# Patient Record
Sex: Female | Born: 1990
Health system: Southern US, Community
[De-identification: ages and names within clinical notes are randomized; demographics above are authoritative.]

## PROBLEM LIST (undated history)

## (undated) DIAGNOSIS — R42 Dizziness and giddiness: Secondary | ICD-10-CM

## (undated) DIAGNOSIS — L309 Dermatitis, unspecified: Secondary | ICD-10-CM

## (undated) DIAGNOSIS — R51 Headache: Secondary | ICD-10-CM

## (undated) DIAGNOSIS — K219 Gastro-esophageal reflux disease without esophagitis: Secondary | ICD-10-CM

## (undated) HISTORY — DX: Gastro-esophageal reflux disease without esophagitis: K21.9

## (undated) HISTORY — DX: Dizziness and giddiness: R42

## (undated) HISTORY — DX: Dermatitis, unspecified: L30.9

## (undated) HISTORY — PX: LAPAROSCOPIC GASTRIC SLEEVE RESECTION: SHX5895

## (undated) HISTORY — PX: TUBAL LIGATION: SHX77

## (undated) HISTORY — PX: LEEP: SHX91

---
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
## 2010-03-21 ENCOUNTER — Inpatient Hospital Stay (HOSPITAL_COMMUNITY): Admission: AD | Admit: 2010-03-21 | Discharge: 2010-03-21 | Payer: Self-pay | Admitting: Obstetrics and Gynecology

## 2012-03-31 ENCOUNTER — Emergency Department (HOSPITAL_COMMUNITY)
Admission: EM | Admit: 2012-03-31 | Discharge: 2012-03-31 | Disposition: A | Discharge status: Home / Self Care | Payer: BC Managed Care – PPO | Attending: Emergency Medicine | Admitting: Emergency Medicine

## 2012-03-31 ENCOUNTER — Encounter (HOSPITAL_COMMUNITY): Payer: Self-pay | Admitting: Emergency Medicine

## 2012-03-31 ENCOUNTER — Emergency Department (HOSPITAL_COMMUNITY): Payer: BC Managed Care – PPO

## 2012-03-31 DIAGNOSIS — R51 Headache: Secondary | ICD-10-CM | POA: Insufficient documentation

## 2012-03-31 HISTORY — DX: Headache: R51

## 2012-03-31 MED ORDER — NAPROXEN 500 MG PO TABS: 500.0000 mg | ORAL_TABLET | Freq: Two times a day (BID) | ORAL | Status: DC

## 2012-03-31 MED ORDER — METOCLOPRAMIDE HCL 5 MG/ML IJ SOLN
10.0000 mg | Freq: Once | INTRAMUSCULAR | Status: AC
  Administered 2012-03-31: 10 mg via INTRAMUSCULAR
  Filled 2012-03-31: qty 2

## 2012-03-31 MED ORDER — HYDROCODONE-ACETAMINOPHEN 5-325 MG PO TABS: 2.0000 | ORAL_TABLET | ORAL | Status: DC | PRN

## 2012-03-31 MED ORDER — KETOROLAC TROMETHAMINE 60 MG/2ML IM SOLN
60.0000 mg | Freq: Once | INTRAMUSCULAR | Status: AC
  Administered 2012-03-31: 60 mg via INTRAMUSCULAR
  Filled 2012-03-31: qty 2

## 2012-03-31 NOTE — ED Notes (Signed)
Patient states she has a history of headaches, but this is different from normal; states worst ever.

## 2012-03-31 NOTE — ED Notes (Signed)
Discharge instructions reviewed with pt, questions answered. Pt verbalized understanding.  

## 2012-03-31 NOTE — ED Notes (Signed)
Patient transported to CT 

## 2012-03-31 NOTE — ED Notes (Signed)
Pt has had a tubal ligation Nov 2012 per Pt.

## 2012-03-31 NOTE — ED Provider Notes (Signed)
History     CSN: [id number]  Arrival date & time 03/31/12  0146   First MD Initiated Contact with Patient 03/31/12 0158      Chief Complaint  Patient presents with  . Headache  . Numbness    (Consider location/radiation/quality/duration/timing/severity/associated sxs/prior treatment) HPI Comments: A 21-year-old female with a history of frequent headaches who presents with complaint of left-sided parietal pain. This pain has been persistent for the last week, seems to be gradually getting worse, is not associated with visual changes, balance problems or weakness of the lower extremities but is associated with intermittent tingling of the right upper and right lower extremity this evening while she was working. She has been taking over-the-counter medications at home without any relief. She denies fevers chills vomiting or photophobia.  Does admit to having intermittent nausea. Regular headaches involve frontal temporal and occipital throbbing pain. This is a parietal throbbing pain which is much worse than her usual headache. She does have a grandmother with a history of cerebral aneurysms. Patient states that she was tested with an MRI 11 years ago but had no abnormal findings per her report.  Patient is a 21 y.o. female presenting with headaches. The history is provided by the patient and a friend.  Headache     Past Medical History  Diagnosis Date  . Headache     History reviewed. No pertinent past surgical history.  No family history on file.  History  Substance Use Topics  . Smoking status: Never Smoker   . Smokeless tobacco: Not on file  . Alcohol Use: No    OB History    Grav Para Term Preterm Abortions TAB SAB Ect Mult Living                  Review of Systems  Neurological: Positive for headaches.  All other systems reviewed and are negative.    Allergies  Review of patient's allergies indicates no known allergies.  Home Medications   Current Outpatient  Rx  Name Route Sig Dispense Refill  . HYDROCODONE-ACETAMINOPHEN 5-325 MG PO TABS Oral Take 2 tablets by mouth every 4 (four) hours as needed for pain. 6 tablet 0  . NAPROXEN 500 MG PO TABS Oral Take 1 tablet (500 mg total) by mouth 2 (two) times daily with a meal. 30 tablet 0    BP 107/81  Pulse 95  Temp 98.5 F (36.9 C) (Oral)  Resp 18  Ht 5' 7.5" (1.715 m)  Wt 220 lb (99.791 kg)  BMI 33.95 kg/m2  SpO2 97%  Physical Exam  Nursing note and vitals reviewed. Constitutional: She appears well-developed and well-nourished. No distress.  HENT:  Head: Normocephalic and atraumatic.  Mouth/Throat: Oropharynx is clear and moist. No oropharyngeal exudate.       Tender to palpation over the scalp of the left posterior parietal area, no masses, abrasions, lacerations, hematomas or abnormalities of the skin are present at this area  Eyes: Conjunctivae normal and EOM are normal. Pupils are equal, round, and reactive to light. Right eye exhibits no discharge. Left eye exhibits no discharge. No scleral icterus.  Neck: Normal range of motion. Neck supple. No JVD present. No thyromegaly present.  Cardiovascular: Normal rate, regular rhythm, normal heart sounds and intact distal pulses.  Exam reveals no gallop and no friction rub.   No murmur heard. Pulmonary/Chest: Effort normal and breath sounds normal. No respiratory distress. She has no wheezes. She has no rales.  Abdominal: Soft. Bowel sounds are  normal. She exhibits no distension and no mass. There is no tenderness.  Musculoskeletal: Normal range of motion. She exhibits no edema and no tenderness.  Lymphadenopathy:    She has no cervical adenopathy.  Neurological: She is alert. Coordination normal.       The patient has normal speech, normal memory, normal coordination of all 4 extremities without any limb ataxia. The patient has normal strength of all 4 extremities at the major muscle groups including shoulders, biceps, triceps, forearm, grips,  quadriceps, hamstrings, calves. Normal sensation to light touch and pinprick of all 4 extremities and the trunk. No truncal ataxia, normal gait, cranial nerves III through XII are intact.  Normal peripheral visual fields. Normal extraocular movements. Normal pupillary exam. No pronator drift, normal reflexes at the brachial radialis, biceps and patellar tendons. Normal position sense, normal temperature sensation to cold and warm  Skin: Skin is warm and dry. No rash noted. No erythema.  Psychiatric: She has a normal mood and affect. Her behavior is normal.    ED Course  Procedures (including critical care time)  Labs Reviewed - No data to display Ct Head Wo Contrast  03/31/2012  *RADIOLOGY REPORT*  Clinical Data: Left posterior headache  CT HEAD WITHOUT CONTRAST  Technique:  Contiguous axial images were obtained from the base of the skull through the vertex without contrast.  Comparison: None.  Findings: There is no evidence for acute hemorrhage, hydrocephalus, mass lesion, or abnormal extra-axial fluid collection.  No definite CT evidence for acute infarction.  The visualized paranasal sinuses and mastoid air cells are predominately clear.  IMPRESSION: No acute intracranial abnormality.   Original Report Authenticated By: Waneta Martins, M.D.      1. Headache       MDM  Well appearing, no distress, normal vital signs, check CT scan of the head for severe or new headache, there is no focal neurologic symptoms on my exam at this time, pain medications ordered parenterally    Patient reevaluated, has had improvement in her pain, CT scan results reviewed with the patient showing no signs of acute abnormalities, referred her to local neurology and family practice for further evaluation including MRI as an outpatient if seen necessary by primary doctor. I do not see an indication for an acute MRI at this time.    Discharge Prescriptions include:  Naprosyn Vicodin  Vida Roller,  MD 03/31/12 [phone]

## 2013-02-11 ENCOUNTER — Emergency Department (HOSPITAL_COMMUNITY): Payer: 59

## 2013-02-11 ENCOUNTER — Encounter (HOSPITAL_COMMUNITY): Payer: Self-pay | Admitting: *Deleted

## 2013-02-11 ENCOUNTER — Emergency Department (HOSPITAL_COMMUNITY)
Admission: EM | Admit: 2013-02-11 | Discharge: 2013-02-11 | Disposition: A | Discharge status: Home / Self Care | Payer: 59 | Attending: Emergency Medicine | Admitting: Emergency Medicine

## 2013-02-11 DIAGNOSIS — R011 Cardiac murmur, unspecified: Secondary | ICD-10-CM | POA: Insufficient documentation

## 2013-02-11 DIAGNOSIS — Z9851 Tubal ligation status: Secondary | ICD-10-CM | POA: Insufficient documentation

## 2013-02-11 DIAGNOSIS — R109 Unspecified abdominal pain: Secondary | ICD-10-CM

## 2013-02-11 DIAGNOSIS — R1032 Left lower quadrant pain: Secondary | ICD-10-CM | POA: Insufficient documentation

## 2013-02-11 DIAGNOSIS — Z3202 Encounter for pregnancy test, result negative: Secondary | ICD-10-CM | POA: Insufficient documentation

## 2013-02-11 DIAGNOSIS — Z791 Long term (current) use of non-steroidal anti-inflammatories (NSAID): Secondary | ICD-10-CM | POA: Insufficient documentation

## 2013-02-11 LAB — CBC WITH DIFFERENTIAL/PLATELET
Basophils Absolute: 0 10*3/uL (ref 0.0–0.1)
Eosinophils Absolute: 0 10*3/uL (ref 0.0–0.7)
Eosinophils Relative: 0 % (ref 0–5)
MCH: 28.2 pg (ref 26.0–34.0)
MCV: 85.4 fL (ref 78.0–100.0)
Neutrophils Relative %: 78 % — ABNORMAL HIGH (ref 43–77)
Platelets: 372 10*3/uL (ref 150–400)
RDW: 13.1 % (ref 11.5–15.5)
WBC: 12.2 10*3/uL — ABNORMAL HIGH (ref 4.0–10.5)

## 2013-02-11 LAB — URINALYSIS, ROUTINE W REFLEX MICROSCOPIC
Glucose, UA: NEGATIVE mg/dL
Leukocytes, UA: NEGATIVE
pH: 6 (ref 5.0–8.0)

## 2013-02-11 LAB — BASIC METABOLIC PANEL
Calcium: 9.7 mg/dL (ref 8.4–10.5)
GFR calc Af Amer: >90 mL/min (ref >90)
GFR calc non Af Amer: >90 mL/min (ref >90)
Sodium: 138 mEq/L (ref 135–145)

## 2013-02-11 LAB — PREGNANCY, URINE: Preg Test, Ur: NEGATIVE

## 2013-02-11 LAB — URINE MICROSCOPIC-ADD ON

## 2013-02-11 MED ORDER — SODIUM CHLORIDE 0.9 % IV BOLUS (SEPSIS)
1000.0000 mL | Freq: Once | INTRAVENOUS | Status: AC
  Administered 2013-02-11: 1000 mL via INTRAVENOUS

## 2013-02-11 MED ORDER — IOHEXOL 300 MG/ML  SOLN
50.0000 mL | Freq: Once | INTRAMUSCULAR | Status: AC | PRN
  Administered 2013-02-11: 50 mL via ORAL

## 2013-02-11 MED ORDER — KETOROLAC TROMETHAMINE 30 MG/ML IJ SOLN
30.0000 mg | Freq: Once | INTRAMUSCULAR | Status: AC
  Administered 2013-02-11: 30 mg via INTRAVENOUS
  Filled 2013-02-11: qty 1

## 2013-02-11 MED ORDER — NAPROXEN 500 MG PO TABS: 500.0000 mg | ORAL_TABLET | Freq: Two times a day (BID) | ORAL | Status: DC

## 2013-02-11 MED ORDER — IOHEXOL 300 MG/ML  SOLN
100.0000 mL | Freq: Once | INTRAMUSCULAR | Status: AC | PRN
  Administered 2013-02-11: 100 mL via INTRAVENOUS

## 2013-02-11 NOTE — ED Notes (Signed)
LLQ pain with N/V,  No diarrhea,

## 2013-02-11 NOTE — ED Provider Notes (Signed)
CSN: [id number]     Arrival date & time 02/11/13  1814 History   First MD Initiated Contact with Patient 02/11/13 1825     Chief Complaint  Patient presents with  . Abdominal Pain   (Consider location/radiation/quality/duration/timing/severity/associated sxs/prior Treatment) HPI Comments: Pt is a 22 y/o female with hx of BTL, hx of 2 days of LLQ pain - came on gradually - is sharp and stabbing, is present daily, all day long but waxes and wanes in intensity.  IT does not radiate, no back pain, no f/c.  She has had little to eat today b/c of pain.  She has n/v with eating but no change in bowel or bladder habits, no dysuria, hematuria, or frequency.  She has no other abd surgery, no pain in the RLQ.  She reports hx of irregular menses - last was 1 month ago.  Patient is a 22 y.o. female presenting with abdominal pain. The history is provided by the patient and a relative.  Abdominal Pain   Past Medical History  Diagnosis Date  . NFAOZHYQ(657.8)    Past Surgical History  Procedure Laterality Date  . Tubal ligation     History reviewed. No pertinent family history. History  Substance Use Topics  . Smoking status: Never Smoker   . Smokeless tobacco: Not on file  . Alcohol Use: No   OB History   Grav Para Term Preterm Abortions TAB SAB Ect Mult Living                 Review of Systems  Gastrointestinal: Positive for abdominal pain.  All other systems reviewed and are negative.    Allergies  Other  Home Medications   Current Outpatient Rx  Name  Route  Sig  Dispense  Refill  . acetaminophen (TYLENOL) 500 MG tablet   Oral   Take 1,000 mg by mouth once as needed for pain.         Marland Kitchen ibuprofen (ADVIL,MOTRIN) 200 MG tablet   Oral   Take 400 mg by mouth once as needed for pain.         . naproxen sodium (ALEVE) 220 MG tablet   Oral   Take 440 mg by mouth once as needed.         . naproxen (NAPROSYN) 500 MG tablet   Oral   Take 1 tablet (500 mg total) by mouth 2  (two) times daily with a meal.   30 tablet   0    BP 132/79  Pulse 79  Temp(Src) 98.1 F (36.7 C) (Oral)  Resp 20  Ht 5\' 7"  (1.702 m)  Wt 200 lb (90.719 kg)  BMI 31.32 kg/m2  SpO2 100%  LMP 01/15/2013 Physical Exam  Nursing note and vitals reviewed. Constitutional: She appears well-developed and well-nourished. No distress.  HENT:  Head: Normocephalic and atraumatic.  Mouth/Throat: Oropharynx is clear and moist. No oropharyngeal exudate.  Eyes: Conjunctivae and EOM are normal. Pupils are equal, round, and reactive to light. Right eye exhibits no discharge. Left eye exhibits no discharge. No scleral icterus.  Neck: Normal range of motion. Neck supple. No JVD present. No thyromegaly present.  Cardiovascular: Normal rate, regular rhythm and intact distal pulses.  Exam reveals no gallop and no friction rub.   Murmur ( soft systolic) heard. Pulmonary/Chest: Effort normal and breath sounds normal. No respiratory distress. She has no wheezes. She has no rales.  Abdominal: Soft. Bowel sounds are normal. She exhibits no distension and no mass. There  is tenderness ( very soft abd but is tender with guarding to the LlQ, no peritoneal signs.  no CVA ttp).  No pain at mcB point or RUQ - no other ttp other than LLQ  Musculoskeletal: Normal range of motion. She exhibits no edema and no tenderness.  Lymphadenopathy:    She has no cervical adenopathy.  Neurological: She is alert. Coordination normal.  Skin: Skin is warm and dry. No rash noted. No erythema.  Psychiatric: She has a normal mood and affect. Her behavior is normal.    ED Course  Procedures (including critical care time) Labs Review Labs Reviewed  URINALYSIS, ROUTINE W REFLEX MICROSCOPIC - Abnormal; Notable for the following:    Specific Gravity, Urine >1.030 (*)    Hgb urine dipstick TRACE (*)    All other components within normal limits  CBC WITH DIFFERENTIAL - Abnormal; Notable for the following:    WBC 12.2 (*)     Neutrophils Relative % 78 (*)    Neutro Abs 9.5 (*)    All other components within normal limits  BASIC METABOLIC PANEL - Abnormal; Notable for the following:    Glucose, Bld 102 (*)    All other components within normal limits  PREGNANCY, URINE  URINE MICROSCOPIC-ADD ON   Imaging Review Ct Abdomen Pelvis W Contrast  02/11/2013   *RADIOLOGY REPORT*  Clinical Data: Left lower quadrant pain  CT ABDOMEN AND PELVIS WITH CONTRAST  Technique:  Multidetector CT imaging of the abdomen and pelvis was performed following the standard protocol during bolus administration of intravenous contrast.  Contrast: 50mL OMNIPAQUE IOHEXOL 300 MG/ML  SOLN, OMNIPAQUE IOHEXOL 300 MG/ML  SOLN  Comparison: None.  Findings: The liver, spleen, pancreas, gallbladder, adrenal glands, kidneys are normal.  The aorta is normal.  There is no abdominal lymphadenopathy.  There is no small bowel obstruction or diverticulitis.  The appendix is normal.  Fluid filled bladder is normal.  The uterus is normal.  Bilateral tubal ligation clips are identified.  There are small cysts in normal size bilateral ovaries.  Minimal free fluid is identified within the pelvis, physiologic.  There is a 3 mm nodule in the inferior left lung base nonspecific.  No acute abnormality is identified in the visualized bones.  IMPRESSION: No acute abnormality identified in the abdomen and pelvis. No evidence of diverticulitis.  Minimal free fluid in the pelvis, physiologic.   Original Report Authenticated By: Sherian Rein, M.D.    MDM   1. Abdominal pain    Pt denies STD hx or vag d/c - she has focal LlQ pain - consider kidney stone, divertic, UTI, ovarian cyst - UA pending to direct w/u.  HD stable and non surgical abd.  UA neg, pt informed of heart murmur and need for f/u.   CT ordered with labs as UA without diagnosis.   9:15 PM, the patient states that she feels much better after Toradol, CT scan negative for any acute findings, laboratory  workup shows a slight leukocytosis. Urinalysis is clear, no signs of infection, stable for discharge.   Meds given in ED:  Medications  sodium chloride 0.9 % bolus 1,000 mL (1,000 mLs Intravenous New Bag/Given 02/11/13 1952)  ketorolac (TORADOL) 30 MG/ML injection 30 mg (30 mg Intravenous Given 02/11/13 1952)  iohexol (OMNIPAQUE) 300 MG/ML solution 50 mL (50 mLs Oral Contrast Given 02/11/13 1941)  iohexol (OMNIPAQUE) 300 MG/ML solution 100 mL (100 mLs Intravenous Contrast Given 02/11/13 2039)    New Prescriptions   NAPROXEN (  NAPROSYN) 500 MG TABLET    Take 1 tablet (500 mg total) by mouth 2 (two) times daily with a meal.      Vida Roller, MD 02/11/13 2114

## 2013-02-13 ENCOUNTER — Inpatient Hospital Stay (EMERGENCY_DEPARTMENT_HOSPITAL)
Admission: AD | Admit: 2013-02-13 | Discharge: 2013-02-14 | Disposition: A | Discharge status: Home / Self Care | Payer: 59 | Source: Ambulatory Visit | Attending: Family Medicine | Admitting: Family Medicine

## 2013-02-13 ENCOUNTER — Encounter (HOSPITAL_COMMUNITY): Payer: Self-pay | Admitting: *Deleted

## 2013-02-13 ENCOUNTER — Inpatient Hospital Stay (HOSPITAL_COMMUNITY): Payer: 59

## 2013-02-13 ENCOUNTER — Emergency Department (HOSPITAL_COMMUNITY)
Admission: EM | Admit: 2013-02-13 | Discharge: 2013-02-13 | Disposition: A | Discharge status: Home / Self Care | Payer: 59 | Attending: Emergency Medicine | Admitting: Emergency Medicine

## 2013-02-13 DIAGNOSIS — R1032 Left lower quadrant pain: Secondary | ICD-10-CM | POA: Insufficient documentation

## 2013-02-13 DIAGNOSIS — R3919 Other difficulties with micturition: Secondary | ICD-10-CM | POA: Insufficient documentation

## 2013-02-13 DIAGNOSIS — R42 Dizziness and giddiness: Secondary | ICD-10-CM | POA: Insufficient documentation

## 2013-02-13 DIAGNOSIS — Z9851 Tubal ligation status: Secondary | ICD-10-CM | POA: Insufficient documentation

## 2013-02-13 DIAGNOSIS — N949 Unspecified condition associated with female genital organs and menstrual cycle: Secondary | ICD-10-CM | POA: Insufficient documentation

## 2013-02-13 DIAGNOSIS — R300 Dysuria: Secondary | ICD-10-CM | POA: Insufficient documentation

## 2013-02-13 DIAGNOSIS — R040 Epistaxis: Secondary | ICD-10-CM | POA: Insufficient documentation

## 2013-02-13 DIAGNOSIS — R112 Nausea with vomiting, unspecified: Secondary | ICD-10-CM | POA: Insufficient documentation

## 2013-02-13 DIAGNOSIS — R509 Fever, unspecified: Secondary | ICD-10-CM | POA: Insufficient documentation

## 2013-02-13 DIAGNOSIS — R110 Nausea: Secondary | ICD-10-CM | POA: Insufficient documentation

## 2013-02-13 DIAGNOSIS — N739 Female pelvic inflammatory disease, unspecified: Secondary | ICD-10-CM

## 2013-02-13 DIAGNOSIS — J029 Acute pharyngitis, unspecified: Secondary | ICD-10-CM | POA: Insufficient documentation

## 2013-02-13 DIAGNOSIS — R102 Pelvic and perineal pain: Secondary | ICD-10-CM

## 2013-02-13 DIAGNOSIS — R197 Diarrhea, unspecified: Secondary | ICD-10-CM | POA: Insufficient documentation

## 2013-02-13 LAB — URINALYSIS, ROUTINE W REFLEX MICROSCOPIC
Bilirubin Urine: NEGATIVE
Ketones, ur: 15 mg/dL — AB
Nitrite: NEGATIVE
pH: 7.5 (ref 5.0–8.0)

## 2013-02-13 LAB — WET PREP, GENITAL

## 2013-02-13 LAB — COMPREHENSIVE METABOLIC PANEL
ALT: 17 U/L (ref 0–35)
Alkaline Phosphatase: 99 U/L (ref 39–117)
CO2: 25 mEq/L (ref 19–32)
GFR calc Af Amer: 85 mL/min — ABNORMAL LOW (ref >90)
Glucose, Bld: 95 mg/dL (ref 70–99)
Potassium: 3.6 mEq/L (ref 3.5–5.1)
Sodium: 135 mEq/L (ref 135–145)
Total Protein: 7.5 g/dL (ref 6.0–8.3)

## 2013-02-13 LAB — RAPID STREP SCREEN (MED CTR MEBANE ONLY): Streptococcus, Group A Screen (Direct): NEGATIVE

## 2013-02-13 LAB — CBC WITH DIFFERENTIAL/PLATELET
Eosinophils Absolute: 0 10*3/uL (ref 0.0–0.7)
Lymphocytes Relative: 9 % — ABNORMAL LOW (ref 12–46)
Lymphs Abs: 0.8 10*3/uL (ref 0.7–4.0)
Neutro Abs: 6.7 10*3/uL (ref 1.7–7.7)
Neutrophils Relative %: 74 % (ref 43–77)
Platelets: 254 10*3/uL (ref 150–400)
RBC: 4.42 MIL/uL (ref 3.87–5.11)
WBC: 9.1 10*3/uL (ref 4.0–10.5)

## 2013-02-13 MED ORDER — IBUPROFEN 400 MG PO TABS
600.0000 mg | ORAL_TABLET | Freq: Once | ORAL | Status: AC
  Administered 2013-02-13: 600 mg via ORAL
  Filled 2013-02-13: qty 2

## 2013-02-13 NOTE — ED Notes (Signed)
Pts mother is upset because the EDP has not been back to see pt. Assured pt that I would notify the EDP that they wanted to see him.

## 2013-02-13 NOTE — ED Notes (Signed)
EDP aware that pt is wanting to speak with him.

## 2013-02-13 NOTE — ED Notes (Signed)
Pt c/o lower abd pain, odor to urine, fever, was seen in er on 02/11/2013, mom states that she is not any better, fever at triage 102.4, last dose of tylenol was a hour ago 2 extra strength tylenol given per mom and pt.

## 2013-02-13 NOTE — ED Notes (Signed)
MD at bedside. 

## 2013-02-13 NOTE — ED Notes (Signed)
After pt was ambulatory to bathroom, Pt feels hot, sweaty, recheck tem 98.6, pt feels nausea,

## 2013-02-13 NOTE — MAU Note (Signed)
Pt presents to MAU sent from [facility] for evaluation. Pt rates pain 9/10 LLQ and dizzines. Nausea, but no vomiting since in ER.

## 2013-02-13 NOTE — ED Notes (Signed)
Patient given discharge instruction, verbalized understand. Patient ambulatory out of the department with Mother and sister

## 2013-02-13 NOTE — MAU Provider Note (Signed)
History     CSN: [id number]  Arrival date and time: 02/13/13 2249   First Provider Initiated Contact with Patient 02/13/13 2334      No chief complaint on file.  HPI  Carolyn Atkinson is a 22 y.o. [id number] who presents today with LLQ pain. She states that the pain started on 02/09/13. She was seen at [facility]. Negative CT. Fever was present, and she was given ibuprofen. Fever has resolved at this time. She states that she had diarrhea yesterday, and today she had some vomiting. She states that she at french fries in the car on the way over from [facility].   Past Medical History  Diagnosis Date  . JYNWGNFA(213.0)     Past Surgical History  Procedure Laterality Date  . Tubal ligation    . Leep      Family History  Problem Relation Age of Onset  . Cancer Mother   . Stroke Father   . Hypertension Maternal Grandmother   . Diabetes Maternal Grandmother   . Cancer Maternal Grandmother   . Heart disease Neg Hx     History  Substance Use Topics  . Smoking status: Never Smoker   . Smokeless tobacco: Not on file  . Alcohol Use: No    Allergies:  Allergies  Allergen Reactions  . Other Hives and Rash    Injection-Name is unknown    Prescriptions prior to admission  Medication Sig Dispense Refill  . acetaminophen (TYLENOL) 500 MG tablet Take 1,000 mg by mouth daily as needed for pain.       Marland Kitchen ibuprofen (ADVIL,MOTRIN) 200 MG tablet Take 400 mg by mouth once as needed for pain.      . naproxen sodium (ALEVE) 220 MG tablet Take 440 mg by mouth once as needed.        ROS Physical Exam   Blood pressure 114/70, pulse 78, temperature 98.7 F (37.1 C), temperature source Oral, resp. rate 18, last menstrual period 01/15/2013.  Physical Exam  Nursing note and vitals reviewed. Constitutional: She is oriented to person, place, and time. She appears well-developed and well-nourished. No distress.  Cardiovascular: Normal rate.   Respiratory: Effort normal.  GI: Soft. She exhibits no mass.  There is tenderness (in the LLQ ). There is no rebound and no guarding.  Neurological: She is alert and oriented to person, place, and time.  Skin: Skin is warm and dry.  Psychiatric: She has a normal mood and affect.    MAU Course  Procedures Results for orders placed during the hospital encounter of 02/13/13 (from the past 24 hour(s))  CBC WITH DIFFERENTIAL     Status: Abnormal   Collection Time    02/13/13  6:08 PM      Result Value Range   WBC 9.1  4.0 - 10.5 K/uL   RBC 4.42  3.87 - 5.11 MIL/uL   Hemoglobin 12.4  12.0 - 15.0 g/dL   HCT 86.5  78.4 - 69.6 %   MCV 86.2  78.0 - 100.0 fL   MCH 28.1  26.0 - 34.0 pg   MCHC 32.5  30.0 - 36.0 g/dL   RDW 29.5  28.4 - 13.2 %   Platelets 254  150 - 400 K/uL   Neutrophils Relative % 74  43 - 77 %   Neutro Abs 6.7  1.7 - 7.7 K/uL   Lymphocytes Relative 9 (*) 12 - 46 %   Lymphs Abs 0.8  0.7 - 4.0 K/uL   Monocytes Relative  18 (*) 3 - 12 %   Monocytes Absolute 1.6 (*) 0.1 - 1.0 K/uL   Eosinophils Relative 0  0 - 5 %   Eosinophils Absolute 0.0  0.0 - 0.7 K/uL   Basophils Relative 0  0 - 1 %   Basophils Absolute 0.0  0.0 - 0.1 K/uL  COMPREHENSIVE METABOLIC PANEL     Status: Abnormal   Collection Time    02/13/13  6:08 PM      Result Value Range   Sodium 135  135 - 145 mEq/L   Potassium 3.6  3.5 - 5.1 mEq/L   Chloride 99  96 - 112 mEq/L   CO2 25  19 - 32 mEq/L   Glucose, Bld 95  70 - 99 mg/dL   BUN 7  6 - 23 mg/dL   Creatinine, Ser 1.61  0.50 - 1.10 mg/dL   Calcium 9.4  8.4 - 09.6 mg/dL   Total Protein 7.5  6.0 - 8.3 g/dL   Albumin 3.7  3.5 - 5.2 g/dL   AST 18  0 - 37 U/L   ALT 17  0 - 35 U/L   Alkaline Phosphatase 99  39 - 117 U/L   Total Bilirubin 0.5  0.3 - 1.2 mg/dL   GFR calc non Af Amer 73 (*) >90 mL/min   GFR calc Af Amer 85 (*) >90 mL/min  RAPID STREP SCREEN     Status: None   Collection Time    02/13/13  6:13 PM      Result Value Range   Streptococcus, Group A Screen (Direct) NEGATIVE  NEGATIVE  WET PREP, GENITAL      Status: Abnormal   Collection Time    02/13/13  6:15 PM      Result Value Range   Yeast Wet Prep HPF POC NONE SEEN  NONE SEEN   Trich, Wet Prep NONE SEEN  NONE SEEN   Clue Cells Wet Prep HPF POC FEW (*) NONE SEEN   WBC, Wet Prep HPF POC MODERATE (*) NONE SEEN  URINALYSIS, ROUTINE W REFLEX MICROSCOPIC     Status: Abnormal   Collection Time    02/13/13  6:30 PM      Result Value Range   Color, Urine YELLOW  YELLOW   APPearance CLEAR  CLEAR   Specific Gravity, Urine 1.020  1.005 - 1.030   pH 7.5  5.0 - 8.0   Glucose, UA NEGATIVE  NEGATIVE mg/dL   Hgb urine dipstick NEGATIVE  NEGATIVE   Bilirubin Urine NEGATIVE  NEGATIVE   Ketones, ur 15 (*) NEGATIVE mg/dL   Protein, ur NEGATIVE  NEGATIVE mg/dL   Urobilinogen, UA 1.0  0.0 - 1.0 mg/dL   Nitrite NEGATIVE  NEGATIVE   Leukocytes, UA NEGATIVE  NEGATIVE    US Transvaginal Non-ob  02/14/2013   *RADIOLOGY REPORT*  Clinical Data: Left lower quadrant pain for 4 days, nausea, vomiting, prior tubal ligation, question PID  TRANSABDOMINAL AND TRANSVAGINAL ULTRASOUND OF PELVIS Technique:  Both transabdominal and transvaginal ultrasound examinations of the pelvis were performed. Transabdominal technique was performed for global imaging of the pelvis including uterus, ovaries, adnexal regions, and pelvic cul-de-sac.  It was necessary to proceed with endovaginal exam following the transabdominal exam to visualize the adnexae.  Comparison:  None  Findings:  Uterus: 7.9 x 3.9 x 5.3 cm.  Normal morphology without mass.  Endometrium: 9 mm thick, normal.  No endometrial fluid.  Right ovary:  3.8 x 2.4 x 2.4 cm.  Normal  morphology without mass.  Left ovary: 3.1 x 1.6 x 3.1 cm.  Normal morphology without mass.  Other findings: Small amount of simple appearing free pelvic fluid. No adnexal masses.  IMPRESSION: Normal exam.   Original Report Authenticated By: Ulyses Southward, M.D.   US Pelvis Complete  02/14/2013   *RADIOLOGY REPORT*  Clinical Data: Left lower quadrant pain  for 4 days, nausea, vomiting, prior tubal ligation, question PID  TRANSABDOMINAL AND TRANSVAGINAL ULTRASOUND OF PELVIS Technique:  Both transabdominal and transvaginal ultrasound examinations of the pelvis were performed. Transabdominal technique was performed for global imaging of the pelvis including uterus, ovaries, adnexal regions, and pelvic cul-de-sac.  It was necessary to proceed with endovaginal exam following the transabdominal exam to visualize the adnexae.  Comparison:  None  Findings:  Uterus: 7.9 x 3.9 x 5.3 cm.  Normal morphology without mass.  Endometrium: 9 mm thick, normal.  No endometrial fluid.  Right ovary:  3.8 x 2.4 x 2.4 cm.  Normal morphology without mass.  Left ovary: 3.1 x 1.6 x 3.1 cm.  Normal morphology without mass.  Other findings: Small amount of simple appearing free pelvic fluid. No adnexal masses.  IMPRESSION: Normal exam.   Original Report Authenticated By: Ulyses Southward, M.D.   0031: C/W Dr. Shawnie Pons. Ok for [employer] home at this time. Will do a 10 day course of doxy.  Assessment and Plan   1. Pelvic pain    Rx: Doxy BID X 10 days FU with [facility] as needed  Tawnya Crook 02/13/2013, 11:36 PM

## 2013-02-13 NOTE — ED Provider Notes (Signed)
CSN: [id number]     Arrival date & time 02/13/13  1727 History  This chart was scribed for Geoffery Lyons, MD by Henri Medal, ED Scribe. This patient was seen in room APA08/APA08 and the patient's care was started at 5:52PM   Chief Complaint  Patient presents with  . Abdominal Pain   The history is provided by the patient and a parent. No language interpreter was used.   HPI Comments: Carolyn Atkinson is a 22 y.o. female who presents to the Emergency Department complaining of LLQ abdominal pain that started 4 days ago.  Associated symptoms include, sore throat, diarrhea, epistaxis, dizziness, malodorous urine, nausea, dysuria, and fever. Pt reports fever at its highest as 102. Pt fever is 102.4 in the ED. Pt reports taking Tylenol PTA without improvement. Pt was seen in the ED two days ago for the same. She had a negative CT scan, negative pregnancy test, and normal UA at the time.  She was discharged with a diagnosis of general abdominal pain. She is back again today for continuing symptoms. Pt is sexually active with one partner. She has a history of two vaginal births and a BTL. Pt denies back pain and vaginal discharge or bleeding.   Past Medical History  Diagnosis Date  . [id number](478.2)    Past Surgical History  Procedure Laterality Date  . Tubal ligation     No family history on file. History  Substance Use Topics  . Smoking status: Never Smoker   . Smokeless tobacco: Not on file  . Alcohol Use: No   OB History   Grav Para Term Preterm Abortions TAB SAB Ect Mult Living                 Review of Systems  Constitutional: Positive for fever.  HENT: Positive for nosebleeds and sore throat.   Gastrointestinal: Positive for abdominal pain and diarrhea.  Genitourinary: Positive for dysuria.  Musculoskeletal: Negative for back pain.  Neurological: Positive for dizziness.  All other systems reviewed and are negative.    Allergies  Other  Home Medications   Current  Outpatient Rx  Name  Route  Sig  Dispense  Refill  . acetaminophen (TYLENOL) 500 MG tablet   Oral   Take 1,000 mg by mouth daily as needed for pain.          Marland Kitchen ibuprofen (ADVIL,MOTRIN) 200 MG tablet   Oral   Take 400 mg by mouth once as needed for pain.         . naproxen sodium (ALEVE) 220 MG tablet   Oral   Take 440 mg by mouth once as needed.          Vital Signs: BP 115/65  Pulse 114  Temp(Src) 102.4 F (39.1 C)  Resp 20  SpO2 99%  LMP 01/15/2013  Physical Exam  Nursing note and vitals reviewed. Constitutional: She is oriented to person, place, and time. She appears well-developed and well-nourished. No distress.  HENT:  Head: Normocephalic and atraumatic.  Eyes: Conjunctivae and EOM are normal.  Neck: Normal range of motion. Neck supple. No tracheal deviation present.  Cardiovascular: Normal rate, regular rhythm and normal heart sounds.   No murmur heard. Pulmonary/Chest: Effort normal and breath sounds normal. No respiratory distress. She has no wheezes. She has no rales.  Abdominal: Soft. Bowel sounds are normal. There is tenderness.  Tenderness to paplpation in LLQ. There is voluntary guarding, but no rebound. No CVA tenderness.  Musculoskeletal: Normal range of  motion. She exhibits no edema.  Neurological: She is alert and oriented to person, place, and time. No cranial nerve deficit.  Skin: Skin is warm and dry.  Psychiatric: She has a normal mood and affect. Her behavior is normal.    ED Course  Procedures (including critical care time)  Medications - No data to display  DIAGNOSTIC STUDIES: Oxygen Saturation is 99% on room air, normal by my interpretation.    COORDINATION OF CARE: 6:00 PM-Discussed treatment plan which includes CBC, UA, pelvic exam, and rapid strep screen with pt at bedside and pt agreed to plan.    Labs Review Labs Reviewed - No data to display Imaging Review Ct Abdomen Pelvis W Contrast  02/11/2013   *RADIOLOGY REPORT*   Clinical Data: Left lower quadrant pain  CT ABDOMEN AND PELVIS WITH CONTRAST  Technique:  Multidetector CT imaging of the abdomen and pelvis was performed following the standard protocol during bolus administration of intravenous contrast.  Contrast: 50mL OMNIPAQUE IOHEXOL 300 MG/ML  SOLN, OMNIPAQUE IOHEXOL 300 MG/ML  SOLN  Comparison: None.  Findings: The liver, spleen, pancreas, gallbladder, adrenal glands, kidneys are normal.  The aorta is normal.  There is no abdominal lymphadenopathy.  There is no small bowel obstruction or diverticulitis.  The appendix is normal.  Fluid filled bladder is normal.  The uterus is normal.  Bilateral tubal ligation clips are identified.  There are small cysts in normal size bilateral ovaries.  Minimal free fluid is identified within the pelvis, physiologic.  There is a 3 mm nodule in the inferior left lung base nonspecific.  No acute abnormality is identified in the visualized bones.  IMPRESSION: No acute abnormality identified in the abdomen and pelvis. No evidence of diverticulitis.  Minimal free fluid in the pelvis, physiologic.   Original Report Authenticated By: Sherian Rein, M.D.    MDM  No diagnosis found. Patient presents here with complaints of left lower quadrant pain and fever has been going on for the past 2 days. She was seen here 2 days ago and had a workup including labs, urine, and CT scan. All these were unremarkable the patient was discharged to home. She spiked a fever to 102.9 at home and feels lightheaded. Her pain in the left lower quadrant is now worsening. She denies any vaginal discharge. She denies any urinary complaints. She has no bowel complaints.  Workup reveals no elevation of white count however she is markedly tender in the left lower quadrant. Pelvic examination reveals moderate white cells on the wet prep. My concern is of pelvic inflammatory disease at this point. I discussed the case with Dr. Orvan Falconer from [facility] it was recommended I  speak with gynecology. I spoke with [facility] Shawnie Pons at [facility] who agrees to evaluate the patient and requests that I transfer her there. She is recommended antibiotics of cefoxitin and doxycycline. I will give the doxycycline here and allow them to give the cefoxitin when she arrives at women's. She appears stable for transfer by private vehicle.   I personally performed the services described in this documentation, which was scribed in my presence. The recorded information has been reviewed and is accurate.       Geoffery Lyons, MD 02/13/13 2101

## 2013-02-14 DIAGNOSIS — R1032 Left lower quadrant pain: Secondary | ICD-10-CM

## 2013-02-14 DIAGNOSIS — R112 Nausea with vomiting, unspecified: Secondary | ICD-10-CM

## 2013-02-14 DIAGNOSIS — N949 Unspecified condition associated with female genital organs and menstrual cycle: Secondary | ICD-10-CM

## 2013-02-14 DIAGNOSIS — Z9851 Tubal ligation status: Secondary | ICD-10-CM

## 2013-02-14 MED ORDER — DOXYCYCLINE HYCLATE 50 MG PO CAPS: 100.0000 mg | ORAL_CAPSULE | Freq: Two times a day (BID) | ORAL | Status: DC

## 2013-02-14 MED ORDER — DOXYCYCLINE HYCLATE 50 MG PO CAPS: 50.0000 mg | ORAL_CAPSULE | Freq: Two times a day (BID) | ORAL | Status: DC

## 2013-02-14 NOTE — MAU Provider Note (Signed)
Chart reviewed and agree with management and plan.  

## 2013-02-15 LAB — GC/CHLAMYDIA PROBE AMP
CT Probe RNA: NEGATIVE
GC Probe RNA: NEGATIVE

## 2013-02-16 LAB — CULTURE, GROUP A STREP

## 2013-09-11 ENCOUNTER — Emergency Department (HOSPITAL_COMMUNITY)
Admission: EM | Admit: 2013-09-11 | Discharge: 2013-09-11 | Disposition: A | Discharge status: Home / Self Care | Payer: 59 | Attending: Emergency Medicine | Admitting: Emergency Medicine

## 2013-09-11 ENCOUNTER — Encounter (HOSPITAL_COMMUNITY): Payer: Self-pay | Admitting: Emergency Medicine

## 2013-09-11 DIAGNOSIS — Z792 Long term (current) use of antibiotics: Secondary | ICD-10-CM | POA: Insufficient documentation

## 2013-09-11 DIAGNOSIS — S61509A Unspecified open wound of unspecified wrist, initial encounter: Secondary | ICD-10-CM | POA: Insufficient documentation

## 2013-09-11 DIAGNOSIS — S61512A Laceration without foreign body of left wrist, initial encounter: Secondary | ICD-10-CM

## 2013-09-11 DIAGNOSIS — Z79899 Other long term (current) drug therapy: Secondary | ICD-10-CM | POA: Insufficient documentation

## 2013-09-11 DIAGNOSIS — Z3202 Encounter for pregnancy test, result negative: Secondary | ICD-10-CM | POA: Insufficient documentation

## 2013-09-11 DIAGNOSIS — F32A Depression, unspecified: Secondary | ICD-10-CM

## 2013-09-11 DIAGNOSIS — F320 Major depressive disorder, single episode, mild: Secondary | ICD-10-CM

## 2013-09-11 DIAGNOSIS — F329 Major depressive disorder, single episode, unspecified: Secondary | ICD-10-CM | POA: Insufficient documentation

## 2013-09-11 DIAGNOSIS — X789XXA Intentional self-harm by unspecified sharp object, initial encounter: Secondary | ICD-10-CM | POA: Insufficient documentation

## 2013-09-11 DIAGNOSIS — F3289 Other specified depressive episodes: Secondary | ICD-10-CM | POA: Insufficient documentation

## 2013-09-11 LAB — PREGNANCY, URINE: Preg Test, Ur: NEGATIVE

## 2013-09-11 LAB — RAPID URINE DRUG SCREEN, HOSP PERFORMED
AMPHETAMINES: NOT DETECTED
BARBITURATES: NOT DETECTED
BENZODIAZEPINES: NOT DETECTED
Cocaine: NOT DETECTED
Opiates: NOT DETECTED
Tetrahydrocannabinol: NOT DETECTED

## 2013-09-11 LAB — URINALYSIS, ROUTINE W REFLEX MICROSCOPIC
Bilirubin Urine: NEGATIVE
Glucose, UA: NEGATIVE mg/dL
Hgb urine dipstick: NEGATIVE
KETONES UR: NEGATIVE mg/dL
Leukocytes, UA: NEGATIVE
NITRITE: NEGATIVE
PROTEIN: NEGATIVE mg/dL
Specific Gravity, Urine: 1.01 (ref 1.005–1.030)
Urobilinogen, UA: 0.2 mg/dL (ref 0.0–1.0)
pH: 6 (ref 5.0–8.0)

## 2013-09-11 MED ORDER — BACITRACIN 500 UNIT/GM EX OINT
1.0000 "application " | TOPICAL_OINTMENT | Freq: Two times a day (BID) | CUTANEOUS | Status: DC
  Administered 2013-09-11: 1 via TOPICAL
  Filled 2013-09-11 (×5): qty 0.9
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
## 2013-09-11 NOTE — Discharge Instructions (Signed)
°Emergency Department Resource Guide °1) Find a Doctor and Pay Out of Pocket °Although you won't have to find out who is covered by your insurance plan, it is a good idea to ask around and get recommendations. You will then need to call the office and see if the doctor you have chosen will accept you as a new patient and what types of options they offer for patients who are self-pay. Some doctors offer discounts or will set up payment plans for their patients who do not have insurance, but you will need to ask so you aren't surprised when you get to your appointment. ° °2) Contact Your Local Health Department °Not all health departments have doctors that can see patients for sick visits, but many do, so it is worth a call to see if yours does. If you don't know where your local health department is, you can check in your phone book. The CDC also has a tool to help you locate your state's health department, and many state websites also have listings of all of their local health departments. ° °3) Find a Walk-in Clinic °If your illness is not likely to be very severe or complicated, you may want to try a walk in clinic. These are popping up all over the country in pharmacies, drugstores, and shopping centers. They're usually staffed by nurse practitioners or physician assistants that have been trained to treat common illnesses and complaints. They're usually fairly quick and inexpensive. However, if you have serious medical issues or chronic medical problems, these are probably not your best option. ° °No Primary Care Doctor: °- Call [facility] at  832-8000 - they can help you locate a primary care doctor that  accepts your insurance, provides certain services, etc. °- Physician Referral Service- 1-800-533-3463 ° °Chronic Pain Problems: °Organization         Address  Phone   Notes  °[facility]  (336) 297-2271 Patients need to be referred by their primary care doctor.  ° °Medication  Assistance: °Organization         Address  Phone   Notes  °[place] [facility] [address] °[place], [place] [address] (336) 641-8030 --Must be a resident of [place] °-- Must have NO insurance coverage whatsoever (no Medicaid/ Medicare, etc.) °-- The pt. MUST have a primary care doctor that directs their care regularly and follows them in the community °  °[facility]  (866) 331-1348   °[employer]  (888) 892-1162   ° °Agencies that provide inexpensive medical care: °Organization         Address  Phone   Notes  °[facility]  (336) 832-8035   °[facility]    (336) 832-7272   °[facility] [address] °[place], [place] [address] (336) 832-4777   °[facility] [address], °[place] (336) 271-4999   °[facility]    (336) 373-0678   °[facility]    (336) 272-1050   °[facility] ° [address], [place] Phone:  (336) 832-4444, Fax:  (336) 832-4440 Hours of Operation:  9 am - 6 pm, M-F.  Also accepts Medicaid/Medicare and self-pay.  °[facility] ° [address], [place] Phone: (336) 832-3150, Fax: (336) [phone]. Hours of Operation:  8:30 am - 5:30 pm, M-F.  Also accepts Medicaid and self-pay.  °[facility] [place] [address], [place] Phone: (336) 878-6027   °[facility] [address], [place], [place] (336)723-1848, Ext. 123 Mondays & Thursdays: 7-9 AM.  First 15 patients are seen on a first come, first serve basis. °  ° °Medicaid-accepting [place] Providers: ° °Organization         Address  Phone   Notes  °[facility] [address], [place] (336) 641-2100 Also accepts self-pay patients.  °[facility] [address], [place] ° (336) 856-9996   °[facility] [address], [place]  (336) 288-8857   °[facility] [address] [place] [address], [place] (336) 299-7000   °Veita Bland [address], [place]  ° (336) 373-1557 Only accepts [place] Access Medicaid patients after they have their name applied to their card.  ° °Self-Pay (no insurance) in [place]: ° °Organization         Address  Phone   Notes  °Sickle Cell Patients, [facility] [address], [place] (336) 832-1970   °[facility] [address], [place] (336) 832-4400   °[facility] [place] ° [address],  (336) 992-4800   °[facility]/Dr. Osei-Bonsu ° [address] [place] [address], [place] or [address], [place] (336) 841-8500 Phone number for both [place] and [place] locations is the same.  °[facility] [address], [place] (336) 299-0000   °[facility] [address] [place] [address], [place] or [address] (336) 852-7530 °(336) 878-2260   °[facility] [address], [place] (336) 350-1642, phone; (336) 294-5005, fax Sees patients 1st and 3rd Saturday of every month.  Must not qualify for public or private insurance (i.e. Medicaid, Medicare, [place] Health Choice, Veterans' Benefits) • Household income should be no more than 200% of the poverty level •The clinic cannot treat you if you are pregnant or think you are pregnant • Sexually transmitted diseases are not treated at the clinic.  ° ° °Dental Care: °Organization         Address  Phone  Notes  °[place] [facility] [address],  (336) 641-6152 Accepts children up to age 21 who are enrolled in Medicaid or [place] Health Choice; pregnant women with a Medicaid card; and children who have applied for Medicaid or [place] Health Choice, but were declined, whose parents can pay a reduced fee at time of service.  °[place]  [facility] [place]  [address], [place] (336) 641-7733 Accepts children up to age 21 who are enrolled in Medicaid or [place] Health Choice; pregnant women with a Medicaid card; and children who have applied for Medicaid or [place] Health Choice, but were declined, whose parents can pay a reduced fee at time of service.  °[facility] ° [address],  (336) 641-4533 Patients are seen by appointment only. Walk-ins are not accepted. [facility] will see patients 18 years of age and older. °Monday - Tuesday (8am-5pm) °Most Wednesdays (8:30-5pm) °$30 per visit, cash only  °[facility] ° [address], [place] (336) 641-4533 Patients are seen by appointment only. Walk-ins are not accepted. [facility] will see patients 18 years of age and older. °One   Wednesday Evening (Monthly: Volunteer Based).  $30 per visit, cash only  °[facility]  (919) 537-3737 for adults; Children under age 4, call Graduate Pediatric Dentistry at ([phone]) [phone]. Children aged 4-14, please call (919) [phone] to request a pediatric application. ° Dental services are provided in all areas of dental care including fillings, crowns and bridges, complete and partial dentures, implants, gum treatment, root canals, and extractions. Preventive care is also provided. Treatment is provided to both adults and children. °Patients are selected via a lottery and there is often a waiting list. °  °[facility] [address], °[place] ° (336) 763-8833 [email or web address] °  °[facility] [address], [place], [place] (336)723-1848, Ext. 123 Second and Fourth Thursday of each month, opens at 6:30 AM; Clinic ends at 9 AM.  Patients are seen on a first-come first-served basis, and a limited number are seen during each clinic.  ° °[facility] ° [address], [place], [place] (336) 723-7904    Eligibility Requirements °You must have lived in [place], [place], or [place] counties for at least the last three months. °  You cannot be eligible for state or federal sponsored healthcare insurance, including Veterans Administration, Medicaid, or Medicare. °  You generally cannot be eligible for healthcare insurance through your employer.  °  How to apply: °Eligibility screenings are held every Tuesday and Wednesday afternoon from 1:00 pm until 4:00 pm. You do not need an appointment for the interview!  °[facility] [address], [place], [place] 336-631-2330   °[facility] [facility]  336-342-8273   °[facility]  336-703-3100   °[facility]  336-570-6415   ° °Behavioral Health Resources in the Community: °Intensive Outpatient Programs °Organization         Address  Phone  Notes  °[facility] [address], [place], [place] 336-878-6098   °[facility] [address], [place], [place] 336-832-9800   °[facility] [address], [place], [place] ° 336-882-2125   °[facility] [address],  °[place], [place] 1-800-853-5163 or 336-641-4981   °Substance Abuse Resources °Organization         Address  Phone  Notes  °[facility]  336-882-2125   °[facility]  336-784-9470   °[facility]  336-285-9073   °[facility]  336-845-3988   °Residential & Outpatient Substance Abuse Program  1-800-659-3381   °Psychological Services °Organization         Address  Phone  Notes  °[facility]  336- 832-9600   °[facility]  336- 378-7881   °[facility] [address], [place] 1-800-853-5163 or 336-641-4981   ° °Mobile Crisis Teams °Organization         Address  Phone  Notes  °[facility], Mobile Crisis Care Unit  1-877-626-1772   °Assertive °[facility] ° [address]  [place], [place] 336-834-9664   °Sharon DeEsch [address] °[place] 336-554-5454   ° °Self-Help/Support Groups °Organization         Address  Phone             Notes  °[facility] - variety of support groups  336- 373-1402 Call for more information  °Narcotics Anonymous (NA), [facility] [address], °[place]   2 meetings at this location  ° °  Residential Treatment Programs °Organization         Address  Phone  Notes  °[facility] [address],    °[place]  1-866-801-8205   °[facility] ° [address], [place], [place] 704-293-8524   °[facility] [address], [place] 336-845-3988 Admissions: 8am-3pm M-F  °[facility] [address]    °[place], [place] 336-841-1104   °[facility] [address], [place], [place] 336-379-7146   °[facility] [address]  °[place], [place] 336-285-9073   °[facility] Intensive Outpatient [address], [place], [place] 336-852-3033   °[facility] [address]  °[place], [place] 1-877-615-2722 or 336-784-9470   °[facility] [address] [place], [place] 336-227-7417 Accepts Medicaid  °[facility] [address]  ° [place] 1-800-659-3381 Substance Abuse/Addiction Treatment  ° °[place] Behavioral Health Resources °Organization         Address  Phone  Notes  °[facility]  (888) 581-9988   °Julie Brannon, PhD [address] [place], [place]   (336) 349-5553 or (336) 951-0000   °[facility]   [address] °[place], [place] (336) 349-4454   °[facility] [address], [place], [place] (336) 342-8316 Insurance/Medicaid/sponsorship through [facility]  °[facility] [address]                                    [place], [place] (336) 342-8316 Therapy/tele-psych/case    °[facility] [address]  ° [place], [place] (336) 349-2233    °Dr. Arfeen  (336) 349-4544   °[facility] [place]  [employer] [place] [facility] 1) [address], [place] °2) [address], [place] °3)  [address], [place] (336) 349-3220 °(336) 342-7768 ° °(336) 342-8140   °[place] [facility] (336) 342-1394 or (336) 342-3537 (After Hours)    ° ° °

## 2013-09-11 NOTE — ED Notes (Signed)
Pt expressing SI.  States "I have a lot going on and just don't want to be here anymore." Reporting cutting left arm.  Superficial cut noted.  Pt does report drinking "a lot" of vodka tonight.  Denies drug use.
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
## 2013-10-18 ENCOUNTER — Ambulatory Visit (INDEPENDENT_AMBULATORY_CARE_PROVIDER_SITE_OTHER): Payer: 59 | Admitting: Nurse Practitioner

## 2013-10-18 ENCOUNTER — Encounter: Payer: Self-pay | Admitting: Nurse Practitioner

## 2013-10-18 ENCOUNTER — Encounter: Payer: Self-pay | Admitting: Family Medicine

## 2013-10-18 VITALS — BP 116/78 | Temp 99.2°F | Ht 66.5 in | Wt 254.0 lb

## 2013-10-18 DIAGNOSIS — J069 Acute upper respiratory infection, unspecified: Secondary | ICD-10-CM

## 2013-10-18 DIAGNOSIS — R300 Dysuria: Secondary | ICD-10-CM

## 2013-10-18 LAB — POCT URINALYSIS DIPSTICK
Spec Grav, UA: 1.015
pH, UA: 7

## 2013-10-18 LAB — POCT URINE PREGNANCY: Preg Test, Ur: NEGATIVE

## 2013-10-18 LAB — GLUCOSE, POCT (MANUAL RESULT ENTRY): POC Glucose: 117 mg/dL — AB (ref 70–99)

## 2013-10-18 MED ORDER — CEFPROZIL 500 MG PO TABS: 500.0000 mg | ORAL_TABLET | Freq: Two times a day (BID) | ORAL | Status: DC

## 2013-10-18 NOTE — Patient Instructions (Signed)
Saline nasal spray vaseline or neosporin  

## 2013-10-20 LAB — POCT UA - MICROSCOPIC ONLY: RBC, urine, microscopic: NEGATIVE

## 2013-10-20 LAB — URINE CULTURE

## 2013-10-21 ENCOUNTER — Encounter: Payer: Self-pay | Admitting: Nurse Practitioner

## 2013-10-21 ENCOUNTER — Telehealth: Payer: Self-pay | Admitting: Family Medicine

## 2013-10-21 NOTE — Telephone Encounter (Signed)
Rx for Cefzil

## 2013-10-21 NOTE — Telephone Encounter (Signed)
Patient states she cant eat anything- just drink-still congested and the pain in her head is getting worse- Patient scheduled follow up office visit.

## 2013-10-21 NOTE — Telephone Encounter (Signed)
Patient said on Monday she was diagnosed with a sinus infection, and has not been able to eat anything all week. She said she gets nauseas when she tries eating and also there is a spot on her head that is very tender and sore. Please advise.

## 2013-10-21 NOTE — Telephone Encounter (Signed)
Did she have problem with appetite before starting Cefzil? Nausea or vomiting? How are her sinus symptoms? As far as the spot on her head, she did not mention this at visit so I have no idea about this since I did not observe it in office. If this is of concern, she needs to schedule office visit for recheck.

## 2013-10-21 NOTE — Progress Notes (Signed)
Subjective:  Presents for several issues. Began having sinus symptoms yesterday. Frontal/ethmoid sinus area headache. Occasional cough. Producing green mucus. Slight sore throat. No ear pain. Occasional mild nose bleed. Head congestion. No excessive bruising or bleeding. No wheezing. Nonsmoker. Also urinary frequency urgency and dysuria for the past 3 days. Denies any history of diabetes. Same sexual partner. No vaginal discharge. No history of recent UTI. Has had a tubal ligation. Her last menstrual cycle was about a week and a half ago.  Objective:   BP 116/78  Temp(Src) 99.2 F (37.3 C)  Ht 5' 6.5" (1.689 m)  Wt 254 lb (115.214 kg)  BMI 40.39 kg/m2  LMP 10/08/2013 NAD. Alert, oriented. TMs clear effusion, no erythema. Pharynx mildly injected with cloudy PND noted. Neck supple with mild soft anterior adenopathy. Lungs clear. No CVA or flank tenderness. Heart regular rate rhythm. Abdomen soft nondistended nontender. Urine microscopic 0-3 WBCs per HPF.    Assessment: Dysuria - Plan: POCT urinalysis dipstick, POCT UA - Microscopic Only, Urine culture, POCT glucose (manual entry), POCT urine pregnancy  Acute upper respiratory infections of unspecified site/probable viral illness Results for orders placed in visit on 10/18/13                            POCT URINALYSIS DIPSTICK      Result Value Ref Range   Color, UA       Clarity, UA       Glucose, UA       Bilirubin, UA       Ketones, UA       Spec Grav, UA 1.015     Blood, UA       pH, UA 7.0     Protein, UA       Urobilinogen, UA       Nitrite, UA       Leukocytes, UA      POCT UA - MICROSCOPIC ONLY      Result Value Ref Range   WBC, Ur, HPF, POC rare     RBC, urine, microscopic neg     Bacteria, U Microscopic rare     Mucus, UA       Epithelial cells, urine per micros rare     Crystals, Ur, HPF, POC       Casts, Ur, LPF, POC       Yeast, UA      GLUCOSE, POCT (MANUAL RESULT ENTRY)      Result Value Ref Range   POC  Glucose 117 (*) 70 - 99 mg/dl  POCT URINE PREGNANCY      Result Value Ref Range   Preg Test, Ur Negative      Plan: Meds ordered this encounter  Medications  . cefPROZIL (CEFZIL) 500 MG tablet    Sig: Take 1 tablet (500 mg total) by mouth 2 (two) times daily.    Dispense:  20 tablet    Refill:  0    Order Specific Question:  Supervising Provider    Answer:  Merlyn AlbertLUKING, WILLIAM S [[id number]]   Reviewed symptomatic care warning signs. Call back in 72 hours if no improvement, sooner if worse.

## 2013-10-22 ENCOUNTER — Ambulatory Visit: Payer: 59 | Admitting: Family Medicine

## 2013-11-04 ENCOUNTER — Telehealth: Payer: Self-pay | Admitting: Family Medicine

## 2013-11-04 NOTE — Telephone Encounter (Signed)
Carolyn Atkinson was charged for a no show on May 15, for recheck with Sherie Don. She was coming to appt and called at time of and told someone that work would not let her leave because girl who was coming in to take her place was taken to the Emergency Room and couldn't come to work so [name] could come to her appointment. Could you please waive her fee because she had intention to come but work would not let her leave. Please call and let her know at [phone]

## 2013-11-04 NOTE — Telephone Encounter (Signed)
We will wave this time but in future will not--everyone has what they feel is a legitimate reason, meanwhile we can't see pts because of so many no shows

## 2013-11-08 ENCOUNTER — Encounter: Payer: 59 | Admitting: Nurse Practitioner

## 2014-04-11 ENCOUNTER — Encounter: Payer: Self-pay | Admitting: Nurse Practitioner

## 2014-07-09 IMAGING — US US PELVIS COMPLETE
1 series · 14 of 25 positions shown · non-contrast
Comparison: None

CLINICAL DATA: Left lower quadrant pain for 4 days, nausea,
vomiting, prior tubal ligation, question PID



[Series 1: us pelvis complete · 14 of 49 slices shown]
[im 1/49]
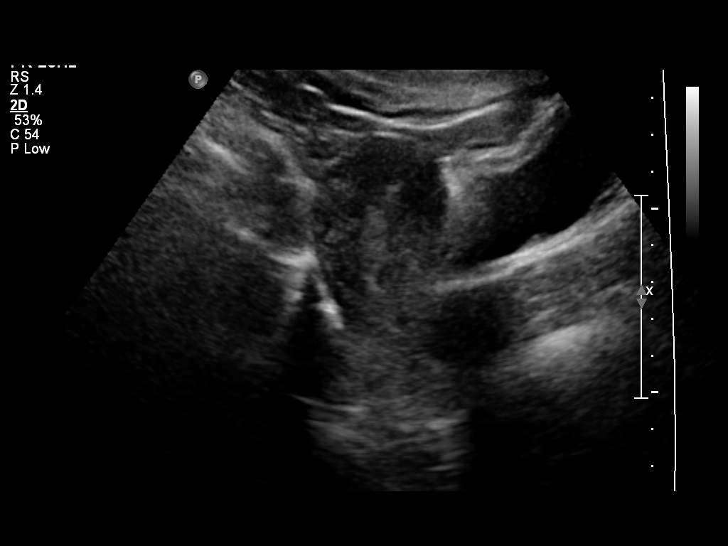
[im 5/49]
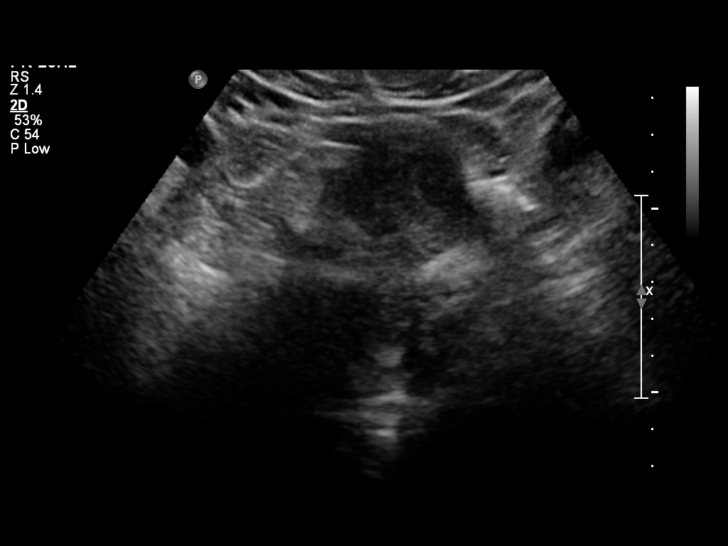
[im 9/49]
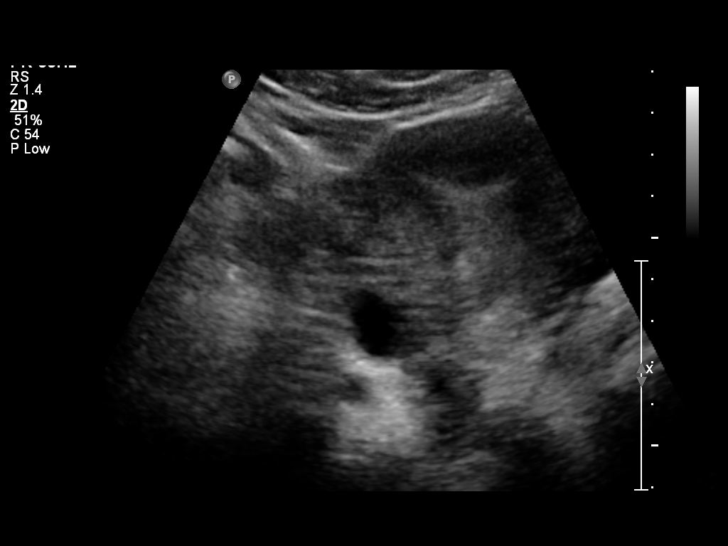
[im 13/49]
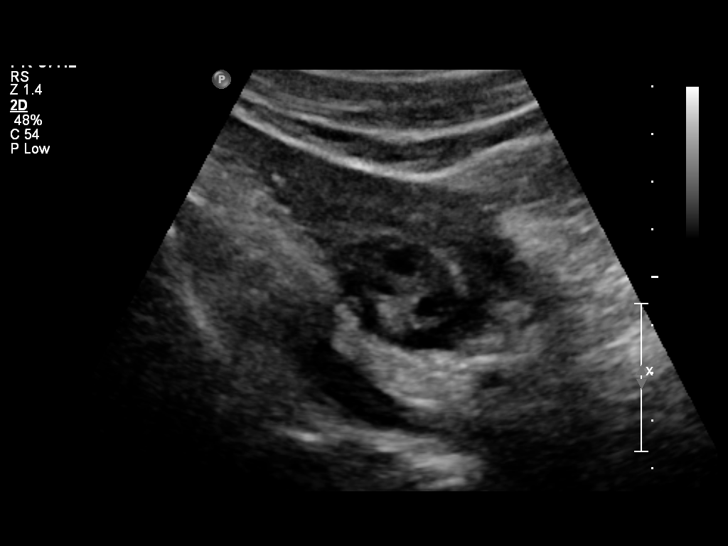
[im 17/49]
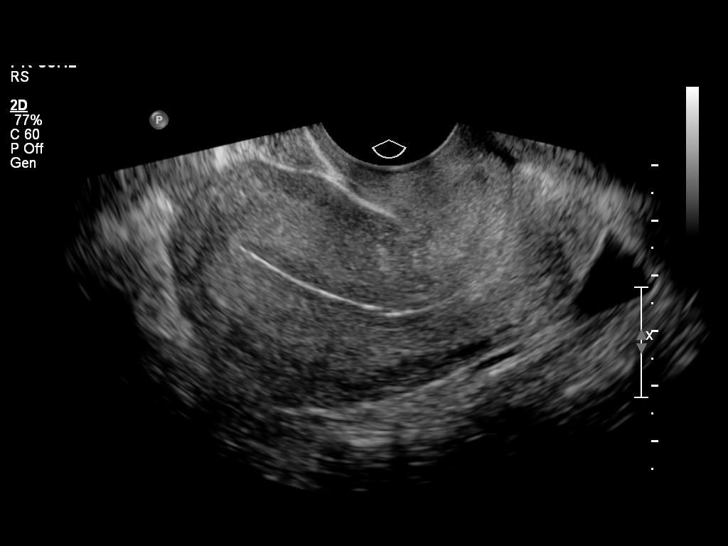
[im 19/49]
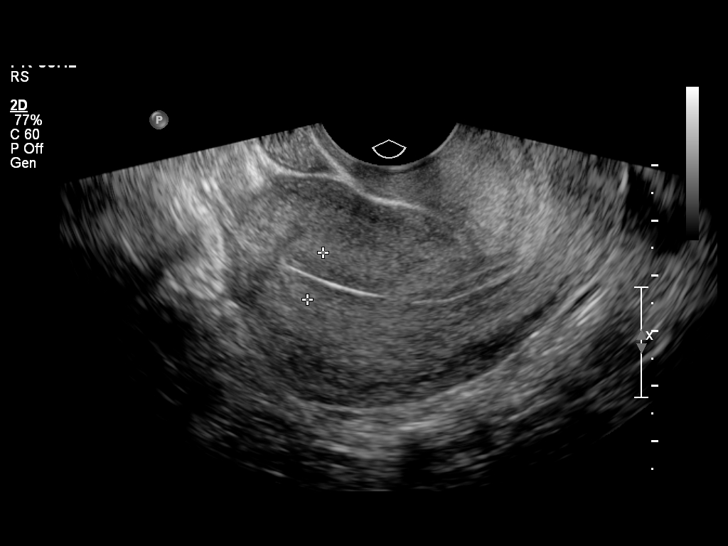
[im 23/49]
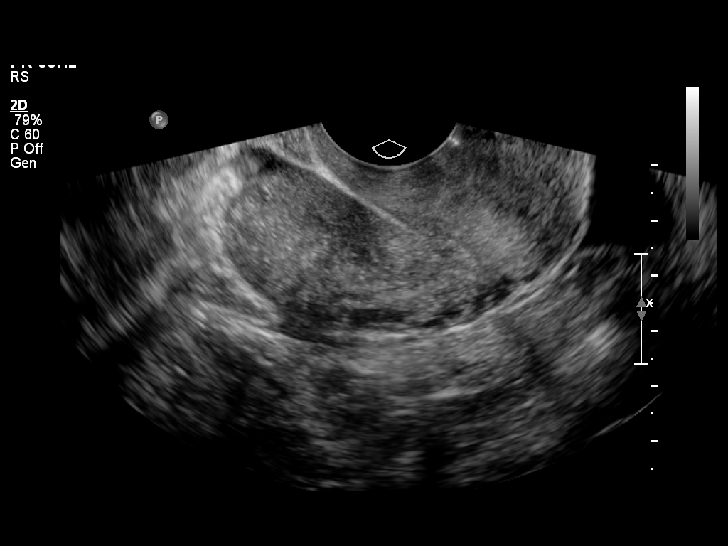
[im 27/49]
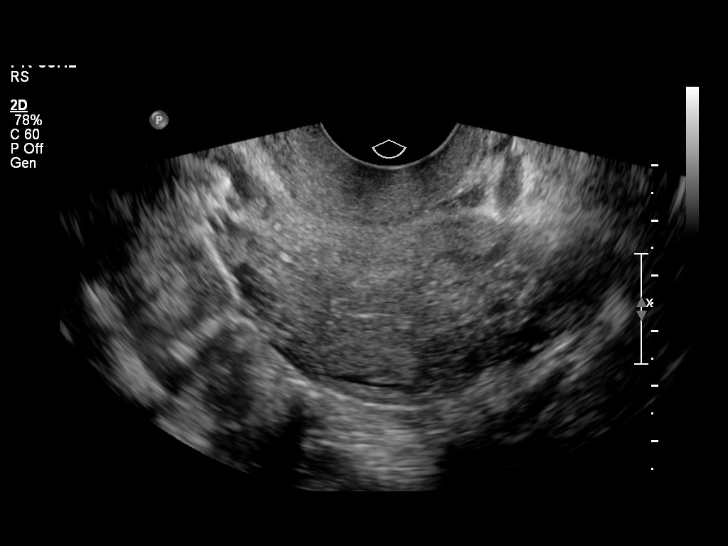
[im 31/49]
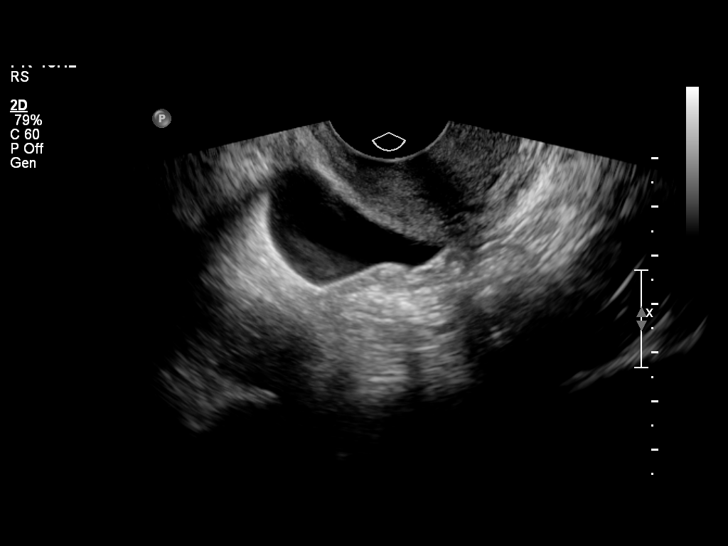
[im 33/49]
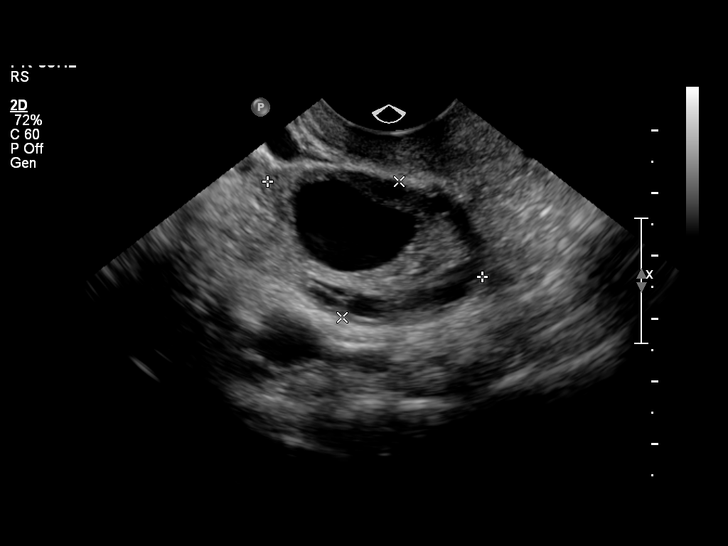
[im 37/49]
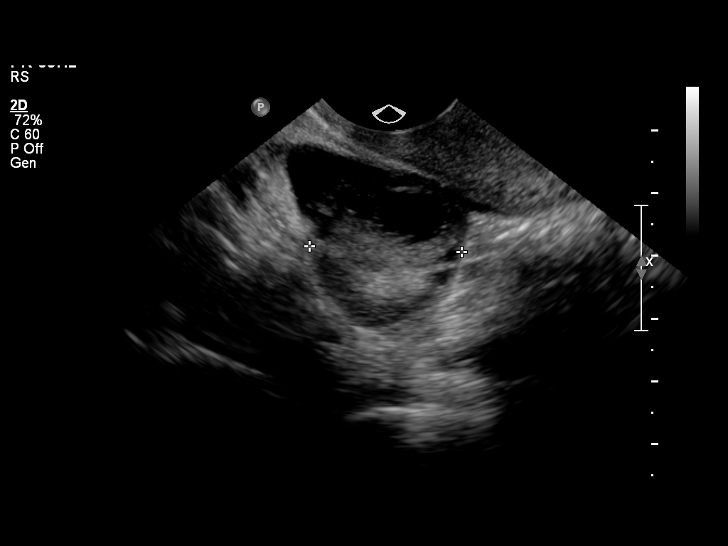
[im 41/49]
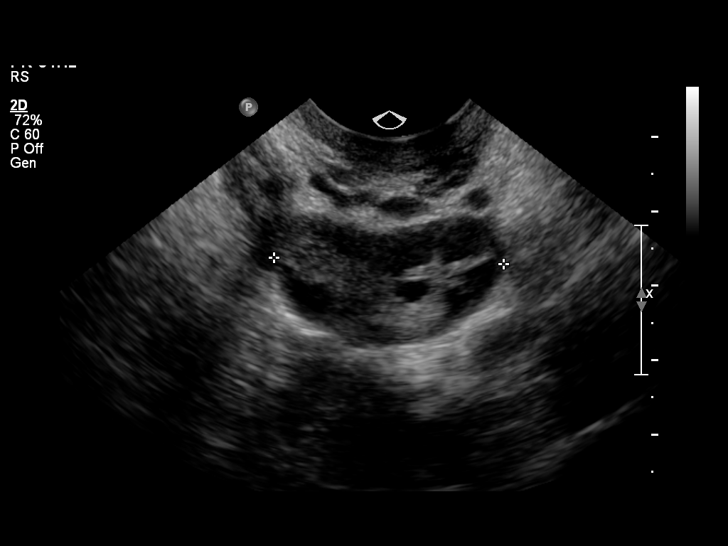
[im 45/49]
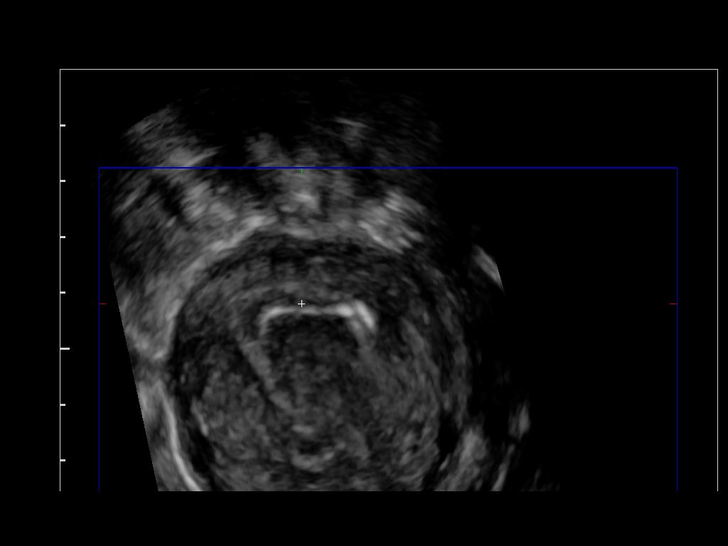
[im 49/49]
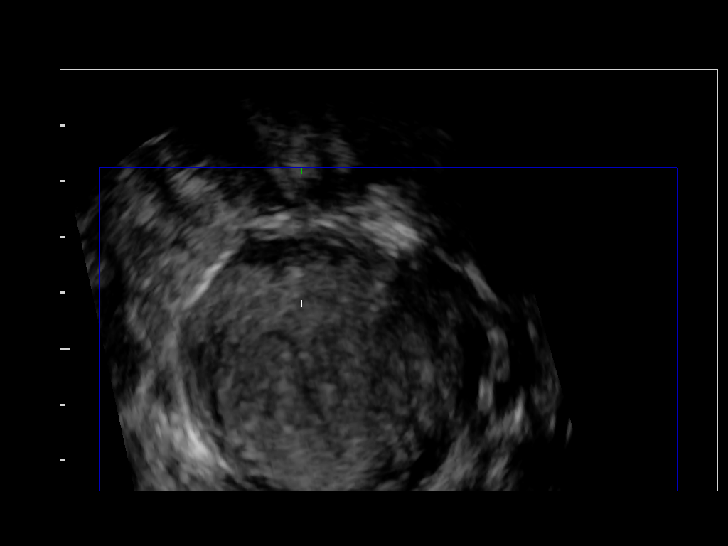

[14 of 25 positions shown; findings below may reference images not displayed]

FINDINGS: Uterus: 7.9 x 3.9 x 5.3 cm.  Normal morphology without mass.

Endometrium: 9 mm thick, normal.  No endometrial fluid.

Right ovary:  3.8 x 2.4 x 2.4 cm.  Normal morphology without mass.

Left ovary: 3.1 x 1.6 x 3.1 cm.  Normal morphology without mass.

Other findings: Small amount of simple appearing free pelvic fluid.
No adnexal masses.
IMPRESSION: Normal exam.

## 2014-10-05 ENCOUNTER — Ambulatory Visit (INDEPENDENT_AMBULATORY_CARE_PROVIDER_SITE_OTHER): Payer: 59 | Admitting: Nurse Practitioner

## 2014-10-05 ENCOUNTER — Encounter: Payer: Self-pay | Admitting: Family Medicine

## 2014-10-05 ENCOUNTER — Encounter: Payer: Self-pay | Admitting: Nurse Practitioner

## 2014-10-05 ENCOUNTER — Ambulatory Visit: Payer: Self-pay | Admitting: Nurse Practitioner

## 2014-10-05 VITALS — BP 116/84 | Ht 66.5 in | Wt 262.4 lb

## 2014-10-05 DIAGNOSIS — A499 Bacterial infection, unspecified: Secondary | ICD-10-CM | POA: Diagnosis not present

## 2014-10-05 DIAGNOSIS — N760 Acute vaginitis: Secondary | ICD-10-CM | POA: Diagnosis not present

## 2014-10-05 DIAGNOSIS — B3731 Acute candidiasis of vulva and vagina: Secondary | ICD-10-CM

## 2014-10-05 DIAGNOSIS — B9689 Other specified bacterial agents as the cause of diseases classified elsewhere: Secondary | ICD-10-CM

## 2014-10-05 DIAGNOSIS — R7309 Other abnormal glucose: Secondary | ICD-10-CM | POA: Diagnosis not present

## 2014-10-05 DIAGNOSIS — R7303 Prediabetes: Secondary | ICD-10-CM

## 2014-10-05 DIAGNOSIS — B373 Candidiasis of vulva and vagina: Secondary | ICD-10-CM | POA: Diagnosis not present

## 2014-10-05 MED ORDER — METRONIDAZOLE 500 MG PO TABS: 500.0000 mg | ORAL_TABLET | Freq: Two times a day (BID) | ORAL | Status: DC

## 2014-10-05 MED ORDER — TERCONAZOLE 0.4 % VA CREA
1.0000 | TOPICAL_CREAM | Freq: Every day | VAGINAL | Status: DC
Start: 2014-10-05 — End: 2014-11-02

## 2014-10-05 NOTE — Patient Instructions (Addendum)
Cycle every 3 months with progesterone Nexplanon Mirena or skyla Depo Provera Low dose birth control pills nuvaring  Acidophilus as directed

## 2014-10-06 ENCOUNTER — Encounter: Payer: Self-pay | Admitting: Nurse Practitioner

## 2014-10-06 DIAGNOSIS — R7303 Prediabetes: Secondary | ICD-10-CM | POA: Insufficient documentation

## 2014-10-06 LAB — POCT WET PREP WITH KOH
Bacteria Wet Prep HPF POC: POSITIVE
CLUE CELLS WET PREP PER HPF POC: POSITIVE
Epithelial Wet Prep HPF POC: POSITIVE
KOH Prep POC: POSITIVE
PH WET PREP: 5.5
RBC Wet Prep HPF POC: NEGATIVE
Trichomonas, UA: NEGATIVE

## 2014-10-06 LAB — POCT GLYCOSYLATED HEMOGLOBIN (HGB A1C): Hemoglobin A1C: 5.8

## 2014-10-06 NOTE — Progress Notes (Signed)
Subjective:  Presents for c/o "repetitive yeast infections" that first started during her pregnancy 6 years ago. Will clear up briefly with use of Monistat, Diflucan or coconut oil. Worse with condom use. Has had tubal ligation. Cycles are very irregular. Can go up to 8-9 months. No cycle for about 4 months which is not unusual. Denies acne or excessive hair growth. No fever. Same sexual partner. No pelvic pain. Occasional urinary urgency or frequency. BMs normal. No N/V.   Objective:   BP 116/84 mmHg  Ht 5' 6.5" (1.689 m)  Wt 262 lb 6.4 oz (119.024 kg)  BMI 41.72 kg/m2  LMP 06/15/2014 NAD. Alert, oriented. Lungs clear. Heart RRR. No CVA or flank tenderness. Abdomen soft, non distended, obese, non tender. External GU: no rashes or lesions. Vagina: large amount of thick slightly greenish discharge. No CMT. Bimanual exam: no tenderness or obvious masses. Mild acanthosis nigricans on posterior neck area. hgba1c 5.8. Results for orders placed or performed in visit on 10/05/14  POCT Wet Prep with KOH  Result Value Ref Range   Trichomonas, UA Negative    Clue Cells Wet Prep HPF POC pos    Epithelial Wet Prep HPF POC pos    Yeast Wet Prep HPF POC occas    Bacteria Wet Prep HPF POC pos    RBC Wet Prep HPF POC neg    WBC Wet Prep HPF POC occas    KOH Prep POC Positive    pH, Wet Prep 5.5      Assessment: Bacterial vaginosis - Plan: POCT Wet Prep with KOH  Vaginal candidiasis - Plan: POCT Wet Prep with KOH  Prediabetes   Plan:  Meds ordered this encounter  Medications  . terconazole (TERAZOL 7) 0.4 % vaginal cream    Sig: Place 1 applicator vaginally at bedtime.    Dispense:  45 g    Refill:  0    Order Specific Question:  Supervising Provider    Answer:  Merlyn AlbertLUKING, WILLIAM S [[id number]]  . metroNIDAZOLE (FLAGYL) 500 MG tablet    Sig: Take 1 tablet (500 mg total) by mouth 2 (two) times daily with a meal.    Dispense:  14 tablet    Refill:  0    Order Specific Question:  Supervising Provider     Answer:  Merlyn AlbertLUKING, WILLIAM S [[id number]]   Strongly suspect PCOS. Recommend physical within the next few months to include hormone levels. Discussed importance of healthy diet low in sugar and simple carbs, regular exercise, weight loss. Recommend some form of hormones to regulate cycle. Acidophilus as directed to prevent yeast infections.  Return if symptoms worsen or fail to improve, for physical.

## 2014-10-27 ENCOUNTER — Encounter: Payer: 59 | Admitting: Nurse Practitioner

## 2014-11-02 ENCOUNTER — Encounter: Payer: Self-pay | Admitting: Nurse Practitioner

## 2014-11-02 ENCOUNTER — Ambulatory Visit (INDEPENDENT_AMBULATORY_CARE_PROVIDER_SITE_OTHER): Payer: 59 | Admitting: Nurse Practitioner

## 2014-11-02 ENCOUNTER — Encounter: Payer: Self-pay | Admitting: Family Medicine

## 2014-11-02 VITALS — BP 126/84 | Temp 98.2°F | Ht 66.5 in | Wt 264.1 lb

## 2014-11-02 DIAGNOSIS — R42 Dizziness and giddiness: Secondary | ICD-10-CM | POA: Diagnosis not present

## 2014-11-02 DIAGNOSIS — R202 Paresthesia of skin: Secondary | ICD-10-CM | POA: Diagnosis not present

## 2014-11-02 DIAGNOSIS — G43009 Migraine without aura, not intractable, without status migrainosus: Secondary | ICD-10-CM

## 2014-11-02 DIAGNOSIS — R200 Anesthesia of skin: Secondary | ICD-10-CM

## 2014-11-02 MED ORDER — ISOMETHEPTENE-DICHLORAL-APAP 65-100-325 MG PO CAPS: ORAL_CAPSULE | ORAL | Status: DC

## 2014-11-02 MED ORDER — TOPIRAMATE 50 MG PO TABS: ORAL_TABLET | ORAL | Status: DC

## 2014-11-03 ENCOUNTER — Other Ambulatory Visit (HOSPITAL_COMMUNITY): Payer: Self-pay

## 2014-11-03 ENCOUNTER — Telehealth: Payer: Self-pay | Admitting: Family Medicine

## 2014-11-03 NOTE — Telephone Encounter (Signed)
LMRC

## 2014-11-03 NOTE — Telephone Encounter (Signed)
With unilateral paraesthesias like that, can hold off on mri and see if she gets better on topomax and tell pt i spoke with carolyn

## 2014-11-03 NOTE — Telephone Encounter (Signed)
Patient says that the price and deductible before being seen for her MRI tomorrow is way too high and she doesn't know if she will be able to have the MRI done tomorrow.  She said she was told to call back if she was going to have any issues getting this done tomorrow.

## 2014-11-04 ENCOUNTER — Ambulatory Visit (HOSPITAL_COMMUNITY): Payer: 59

## 2014-11-04 NOTE — Telephone Encounter (Signed)
Discussed with patient. Patient advised: Per Dr Brett CanalesSteve with unilateral paraesthesias like that, can hold off on mri and see if she gets better on topomax and pt advised Dr Brett CanalesSteve spoke with carolyn. Patient verbalized understanding and states she will call and cancel the MRI.

## 2014-11-05 ENCOUNTER — Encounter: Payer: Self-pay | Admitting: Nurse Practitioner

## 2014-11-05 DIAGNOSIS — G43009 Migraine without aura, not intractable, without status migrainosus: Secondary | ICD-10-CM | POA: Insufficient documentation

## 2014-11-05 NOTE — Progress Notes (Signed)
Subjective:  Presents for c/o headaches over the past 2-3 months. Describes as sharp pain and feels her heartbeat. Involves the entire head. Dizziness, sometimes intense. Has to leave work this week due to headache and dizziness. Occasional nosebleed. 10 out of 10 on pain scale. Photosensitivity and phonophobia. No visual changes. Some right hand numbness during headache. No difficulty speaking or swallowing. No other areas of numbness or weakness. No aura. No caffeine intake. No relief with Ibuprofen 800 mg. According to headache diary, she has had 5 headaches since 5/16. Tubal ligation for birth control. Has 2 jobs. Stress level 10/10. No family history of migraines.   Objective:   BP 126/84 mmHg  Temp(Src) 98.2 F (36.8 C) (Oral)  Ht 5' 6.5" (1.689 m)  Wt 264 lb 2 oz (119.806 kg)  BMI 42.00 kg/m2  LMP 06/15/2014 NAD. Alert, oriented. TMs normal. Pharynx clear. Neck supple with mild adenopathy. Lungs clear. Heart RRR. Pupils equal and reactive to light. Fundoscopic: optic disc sharp. EOMs intact without nystagmus. Point to point localization normal. Hand strength 5+ bilat. Reflexes normal. Romberg neg.   Assessment:  Migraine without aura and without status migrainosus, not intractable - Plan: MR Brain Wo Contrast, CANCELED: CT Head Wo Contrast  Dizziness - Plan: MR Brain Wo Contrast, CANCELED: CT Head Wo Contrast  Numbness and tingling in right hand - Plan: MR Brain Wo Contrast, CANCELED: CT Head Wo Contrast  Plan:  Meds ordered this encounter  Medications  . topiramate (TOPAMAX) 50 MG tablet    Sig: 1/2 tab po qhs x 6 d then one po qhs    Dispense:  30 tablet    Refill:  0    Order Specific Question:  Supervising Provider    Answer:  Merlyn AlbertLUKING, WILLIAM S [[id number]]  . isometheptene-acetaminophen-dichloralphenazone (MIDRIN) 65-100-325 MG capsule    Sig: Two po at onset of migraine; max 5 in 12 hours    Dispense:  30 capsule    Refill:  0    Order Specific Question:  Supervising Provider     Answer:  Riccardo DubinLUKING, WILLIAM S [[id number]]   Will avoid use of triptans due to hand symptoms.  Return in about 3 weeks (around 11/23/2014) for recheck. Call back sooner if any problems.

## 2014-11-14 ENCOUNTER — Encounter: Payer: 59 | Admitting: Nurse Practitioner

## 2014-11-23 ENCOUNTER — Ambulatory Visit: Payer: 59 | Admitting: Nurse Practitioner

## 2014-11-23 DIAGNOSIS — Z029 Encounter for administrative examinations, unspecified: Secondary | ICD-10-CM

## 2015-05-30 ENCOUNTER — Emergency Department (HOSPITAL_COMMUNITY)
Admission: EM | Admit: 2015-05-30 | Discharge: 2015-05-30 | Disposition: A | Discharge status: Home / Self Care | Payer: 59 | Attending: Emergency Medicine | Admitting: Emergency Medicine

## 2015-05-30 ENCOUNTER — Encounter (HOSPITAL_COMMUNITY): Payer: Self-pay | Admitting: Emergency Medicine

## 2015-05-30 ENCOUNTER — Emergency Department (HOSPITAL_COMMUNITY): Payer: 59

## 2015-05-30 DIAGNOSIS — R51 Headache: Secondary | ICD-10-CM | POA: Diagnosis present

## 2015-05-30 DIAGNOSIS — R42 Dizziness and giddiness: Secondary | ICD-10-CM | POA: Insufficient documentation

## 2015-05-30 DIAGNOSIS — G8929 Other chronic pain: Secondary | ICD-10-CM | POA: Insufficient documentation

## 2015-05-30 DIAGNOSIS — R519 Headache, unspecified: Secondary | ICD-10-CM

## 2015-05-30 LAB — POC URINE PREG, ED: PREG TEST UR: NEGATIVE

## 2015-05-30 MED ORDER — METOCLOPRAMIDE HCL 5 MG/ML IJ SOLN
10.0000 mg | Freq: Once | INTRAMUSCULAR | Status: AC
  Administered 2015-05-30: 10 mg via INTRAVENOUS
  Filled 2015-05-30: qty 2

## 2015-05-30 MED ORDER — HYDROCODONE-ACETAMINOPHEN 5-325 MG PO TABS: 1.0000 | ORAL_TABLET | ORAL | Status: DC | PRN

## 2015-05-30 MED ORDER — KETOROLAC TROMETHAMINE 30 MG/ML IJ SOLN
30.0000 mg | Freq: Once | INTRAMUSCULAR | Status: AC
  Administered 2015-05-30: 30 mg via INTRAVENOUS
  Filled 2015-05-30: qty 1

## 2015-05-30 MED ORDER — DEXAMETHASONE SODIUM PHOSPHATE 10 MG/ML IJ SOLN
10.0000 mg | Freq: Once | INTRAMUSCULAR | Status: AC
  Administered 2015-05-30: 10 mg via INTRAVENOUS
  Filled 2015-05-30: qty 1

## 2015-05-30 MED ORDER — KETOROLAC TROMETHAMINE 60 MG/2ML IM SOLN: 60.0000 mg | Freq: Once | INTRAMUSCULAR | Status: DC

## 2015-05-30 MED ORDER — SODIUM CHLORIDE 0.9 % IV BOLUS (SEPSIS)
1000.0000 mL | Freq: Once | INTRAVENOUS | Status: AC
  Administered 2015-05-30: 1000 mL via INTRAVENOUS

## 2015-05-30 MED ORDER — DIPHENHYDRAMINE HCL 50 MG/ML IJ SOLN
50.0000 mg | Freq: Once | INTRAMUSCULAR | Status: AC
  Administered 2015-05-30: 50 mg via INTRAVENOUS
  Filled 2015-05-30: qty 1

## 2015-05-30 MED ORDER — HYDROCODONE-ACETAMINOPHEN 5-325 MG PO TABS
1.0000 | ORAL_TABLET | Freq: Once | ORAL | Status: AC
  Administered 2015-05-30: 1 via ORAL
  Filled 2015-05-30: qty 1

## 2015-05-30 NOTE — ED Provider Notes (Signed)
CSN: [id number]     Arrival date & time 05/30/15  0803 History   First MD Initiated Contact with Patient 05/30/15 [phone]     Chief Complaint  Patient presents with  . Headache     (Consider location/radiation/quality/duration/timing/severity/associated sxs/prior Treatment) The history is provided by the patient.   Michell HeinrichJazmin B Atkinson is a 24 y.o. female presenting with a 5-6 year history of intermittent headaches which have become more persistent and worsening over the past 6 months, stating she has nearly constant daily headaches not relieved by topamax or midrin prescribed by her pcp or otc medicines including tylenol.  She describes bilateral headache, constant and sharp, today localized to the left posterior head.  She has associated dizziness which is intermittent and states almost daily nosebleeds, her last nose bleed was yesterday and lasted about an hour despite applying nose pressure.  She denies nasal or sinus congestion or pain.  She denies photophobia or phonophobia with these headaches, no vision changes, no focal weakness, no nausea or emesis.  Her headaches are persistent at night but not worsened.  She reports having a headache most days, rarely will have a symptom-free day.      Past Medical History  Diagnosis Date  . [id number](956.2Headache(784.0)    Past Surgical History  Procedure Laterality Date  . Tubal ligation    . Leep     Family History  Problem Relation Age of Onset  . Cancer Mother   . Stroke Father   . Hypertension Maternal Grandmother   . Diabetes Maternal Grandmother   . Cancer Maternal Grandmother   . Heart disease Neg Hx    Social History  Substance Use Topics  . Smoking status: Never Smoker   . Smokeless tobacco: Never Used  . Alcohol Use: No   OB History    Gravida Para Term Preterm AB TAB SAB Ectopic Multiple Living   2 2 1 1      2      Review of Systems  Constitutional: Negative.  Negative for fever.  HENT: Negative for congestion, sinus pressure and  sore throat.   Eyes: Negative.  Negative for photophobia and visual disturbance.  Respiratory: Negative for chest tightness and shortness of breath.   Cardiovascular: Negative for chest pain.  Gastrointestinal: Negative for nausea and abdominal pain.  Genitourinary: Negative.   Musculoskeletal: Negative for joint swelling, arthralgias and neck pain.  Skin: Negative.  Negative for rash and wound.  Neurological: Positive for dizziness and headaches. Negative for weakness, light-headedness and numbness.  Psychiatric/Behavioral: Negative.       Allergies  Other  Home Medications   Prior to Admission medications   Medication Sig Start Date End Date Taking? Authorizing Provider  acetaminophen (TYLENOL) 500 MG tablet Take 1,000 mg by mouth daily as needed for pain.    Yes Historical Provider, MD  isometheptene-acetaminophen-dichloralphenazone (MIDRIN) 864082409665-100-325 MG capsule Two po at onset of migraine; max 5 in 12 hours 11/02/14  Yes Campbell Richesarolyn C Hoskins, NP  topiramate (TOPAMAX) 50 MG tablet 1/2 tab po qhs x 6 d then one po qhs 11/02/14  Yes Campbell Richesarolyn C Hoskins, NP  HYDROcodone-acetaminophen (NORCO/VICODIN) 5-325 MG tablet Take 1 tablet by mouth every 4 (four) hours as needed. 05/30/15   Burgess AmorJulie Pearlene Teat, PA-C  ibuprofen (ADVIL,MOTRIN) 200 MG tablet Take 400 mg by mouth once as needed for pain.    Historical Provider, MD   BP 100/62 mmHg  Pulse 56  Temp(Src) 97.5 F (36.4 C) (Oral)  Resp 14  Ht   (1.676 m)  Wt 104.327 kg  BMI 37.14 kg/m2  SpO2 98% Physical Exam  Constitutional: She is oriented to person, place, and time. She appears well-developed and well-nourished.  Uncomfortable appearing.  Tearful.  HENT:  Head: Normocephalic and atraumatic.  Mouth/Throat: Oropharynx is clear and moist.  No epistaxis or localized lesion in either nostril as source of nosebleed.  Eyes: EOM are normal. Pupils are equal, round, and reactive to light.  Neck: Normal range of motion. Neck supple.   Cardiovascular: Normal rate and normal heart sounds.   Pulmonary/Chest: Effort normal.  Musculoskeletal: Normal range of motion.  Lymphadenopathy:    She has no cervical adenopathy.  Neurological: She is alert and oriented to person, place, and time. She has normal strength. No sensory deficit. Gait normal. GCS eye subscore is 4. GCS verbal subscore is 5. GCS motor subscore is 6.  Normal heel-shin, normal rapid alternating movements. Cranial nerves III-XII intact.  No pronator drift.  Skin: Skin is warm and dry. No rash noted.  Psychiatric: She has a normal mood and affect. Her speech is normal and behavior is normal. Thought content normal. Cognition and memory are normal.  Nursing note and vitals reviewed.   ED Course  Procedures (including critical care time) Labs Review Labs Reviewed  POC URINE PREG, ED    Imaging Review Mr Brain Wo Contrast  05/30/2015  CLINICAL DATA:  Headaches, 6 years duration. EXAM: MRI HEAD WITHOUT CONTRAST TECHNIQUE: Multiplanar, multiecho pulse sequences of the brain and surrounding structures were obtained without intravenous contrast. COMPARISON:  Head CT 03/31/2012 FINDINGS: The brain has a normal appearance on all pulse sequences without evidence of malformation, atrophy, old or acute infarction, mass lesion, hemorrhage, hydrocephalus or extra-axial collection. No pituitary mass. No fluid in the sinuses, middle ears or mastoids. No skull or skullbase lesion. There is flow in the major vessels at the base of the brain. Major venous sinuses show flow. IMPRESSION: Normal MRI of the brain.  No cause of headaches identified. Electronically Signed   By: Paulina Fusi M.D.   On: 05/30/2015 10:07   I have personally reviewed and evaluated these images and lab results as part of my medical decision-making.   EKG Interpretation None      MDM   Final diagnoses:  Chronic nonintractable headache, unspecified headache type    Patients labs reviewed.   Radiological studies were viewed, interpreted and considered during the medical decision making and disposition process. I agree with radiologists reading.  Results were also discussed with patient. Pt with nearly daily generalized headache. MRI today negative for structural source.  She was given IV fluids, migraine cocktail including reglan, benadryl and decadron.  Headache reduced from 10 to 8/10.  Toradol added with no improvement.  Hydrocodone given with improved headache although still present.  Referral given to Dr Gerilyn Pilgrim to further eval these chronic headaches.  Given small script for hydrocodone.    The patient appears reasonably screened and/or stabilized for discharge and I doubt any other medical condition or other [facility] requiring further screening, evaluation, or treatment in the ED at this time prior to discharge.      Burgess Amor, PA-C 05/30/15 2314  Loren Racer, MD 05/31/15 [phone]

## 2015-05-30 NOTE — ED Notes (Signed)
Having headaches for last 5-6 years, rates pain 10/10.  Treated by Dr Gerda DissLuking but not sure of name of medication, last taken yesterday with no relief.  Pt says she is having nose bleeds with clots, last being yesterday.

## 2015-05-30 NOTE — ED Notes (Signed)
Patient with no complaints at this time. Respirations even and unlabored. Skin warm/dry. Discharge instructions reviewed with patient at this time. Patient given opportunity to voice concerns/ask questions. IV removed per policy and band-aid applied to site. Patient discharged at this time and left Emergency Department with steady gait.  

## 2015-05-30 NOTE — Discharge Instructions (Signed)
General Headache Without Cause °A headache is pain or discomfort felt around the head or neck area. The specific cause of a headache may not be found. There are many causes and types of headaches. A few common ones are: °· Tension headaches. °· Migraine headaches. °· Cluster headaches. °· Chronic daily headaches. °HOME CARE INSTRUCTIONS  °Watch your condition for any changes. Take these steps to help with your condition: °Managing Pain °· Take over-the-counter and prescription medicines only as told by your health care provider. °· Lie down in a dark, quiet room when you have a headache. °· If directed, apply ice to the head and neck area: °¨ Put ice in a plastic bag. °¨ Place a towel between your skin and the bag. °¨ Leave the ice on for 20 minutes, 2-3 times per day. °· Use a heating pad or hot shower to apply heat to the head and neck area as told by your health care provider. °· Keep lights dim if bright lights bother you or make your headaches worse. °Eating and Drinking °· Eat meals on a regular schedule. °· Limit alcohol use. °· Decrease the amount of caffeine you drink, or stop drinking caffeine. °General Instructions °· Keep all follow-up visits as told by your health care provider. This is important. °· Keep a headache journal to help find out what may trigger your headaches. For example, write down: °¨ What you eat and drink. °¨ How much sleep you get. °¨ Any change to your diet or medicines. °· Try massage or other relaxation techniques. °· Limit stress. °· Sit up straight, and do not tense your muscles. °· Do not use tobacco products, including cigarettes, chewing tobacco, or e-cigarettes. If you need help quitting, ask your health care provider. °· Exercise regularly as told by your health care provider. °· Sleep on a regular schedule. Get 7-9 hours of sleep, or the amount recommended by your health care provider. °SEEK MEDICAL CARE IF:  °· Your symptoms are not helped by medicine. °· You have a  headache that is different from the usual headache. °· You have nausea or you vomit. °· You have a fever. °SEEK IMMEDIATE MEDICAL CARE IF:  °· Your headache becomes severe. °· You have repeated vomiting. °· You have a stiff neck. °· You have a loss of vision. °· You have problems with speech. °· You have pain in the eye or ear. °· You have muscular weakness or loss of muscle control. °· You lose your balance or have trouble walking. °· You feel faint or pass out. °· You have confusion. °  °This information is not intended to replace advice given to you by your health care provider. Make sure you discuss any questions you have with your health care provider. °  °Document Released: 05/27/2005 Document Revised: 02/15/2015 Document Reviewed: 09/19/2014 °Elsevier Interactive Patient Education ©2016 [employer] ° °You may take the hydrocodone prescribed for pain relief.  This will make you drowsy - do not drive within 4 hours of taking this medication. ° ° °

## 2015-09-05 ENCOUNTER — Ambulatory Visit (INDEPENDENT_AMBULATORY_CARE_PROVIDER_SITE_OTHER): Payer: 59 | Admitting: Family Medicine

## 2015-09-05 VITALS — BP 110/74 | Temp 98.4°F | Ht 66.5 in | Wt 266.0 lb

## 2015-09-05 DIAGNOSIS — J020 Streptococcal pharyngitis: Secondary | ICD-10-CM | POA: Diagnosis not present

## 2015-09-05 DIAGNOSIS — J029 Acute pharyngitis, unspecified: Secondary | ICD-10-CM | POA: Diagnosis not present

## 2015-09-05 LAB — POCT RAPID STREP A (OFFICE): Rapid Strep A Screen: POSITIVE — AB

## 2015-09-05 MED ORDER — AZITHROMYCIN 250 MG PO TABS: ORAL_TABLET | ORAL | Status: DC

## 2015-09-05 MED ORDER — ONDANSETRON HCL 8 MG PO TABS: 8.0000 mg | ORAL_TABLET | Freq: Three times a day (TID) | ORAL | Status: DC | PRN

## 2015-09-05 NOTE — Progress Notes (Signed)
   Subjective:    Patient ID: Carolyn HeinrichJazmin B Stange, female    DOB: 07/07/1990, 25 y.o.   MRN: [id number]  Sore Throat  This is a new problem. Episode onset: 3 days ago. There has been no fever. Associated symptoms include vomiting. Treatments tried: benadryl.  Patient with significant sore throat over the past few days in addition to this burning in the throat denies vomiting or diarrhea states energy level subpar. Denies wheezing difficulty breathing. PMH benign    Review of Systems  Gastrointestinal: Positive for vomiting.  Sore throat denies high fever chills body aches denies diarrhea but does relate nausea and some vomiting     Objective:   Physical Exam Throat erythematous no sign of any type of abscess noted neck with some adenopathy lungs are clear hearts regular  Rapid strep positive     Assessment & Plan:  Strep pharyngitis antibiotics prescribed warning signs discussed follow-up if problems Zofran as needed for nausea If not seen significant improvement over the next 48 hours follow-up

## 2015-12-11 ENCOUNTER — Encounter: Payer: Self-pay | Admitting: Family Medicine

## 2015-12-11 ENCOUNTER — Ambulatory Visit (INDEPENDENT_AMBULATORY_CARE_PROVIDER_SITE_OTHER): Payer: 59 | Admitting: Family Medicine

## 2015-12-11 VITALS — BP 118/78 | Temp 98.3°F | Ht 66.5 in | Wt 264.5 lb

## 2015-12-11 DIAGNOSIS — J029 Acute pharyngitis, unspecified: Secondary | ICD-10-CM

## 2015-12-11 DIAGNOSIS — B373 Candidiasis of vulva and vagina: Secondary | ICD-10-CM | POA: Diagnosis not present

## 2015-12-11 DIAGNOSIS — B349 Viral infection, unspecified: Secondary | ICD-10-CM | POA: Diagnosis not present

## 2015-12-11 DIAGNOSIS — B3731 Acute candidiasis of vulva and vagina: Secondary | ICD-10-CM

## 2015-12-11 LAB — POCT RAPID STREP A (OFFICE): Rapid Strep A Screen: NEGATIVE

## 2015-12-11 MED ORDER — FLUCONAZOLE 150 MG PO TABS: 150.0000 mg | ORAL_TABLET | Freq: Once | ORAL | Status: DC

## 2015-12-11 NOTE — Progress Notes (Signed)
   Subjective:    Patient ID: Carolyn Atkinson, female    DOB: 03/21/1991, 25 y.o.   MRN: [id number]  Sore Throat  This is a new problem. The current episode started in the past 7 days. Associated symptoms include abdominal pain. Associated symptoms comments: Painful urination. She has tried nothing for the symptoms.  Patient has had strep throat in the past Patient also has concerns of yeast infections. Some vaginal itching and burning  Review of Systems  Gastrointestinal: Positive for abdominal pain.  Complains of intermittent sore throat denies high fever chills sweats     Objective:   Physical Exam  Throat minimal erythema neck is supple lungs clear heart regular      Assessment & Plan:  Yeast vaginitis prescription given Viral sore throat no need for antibiotics If worsening over the next few days call us may need to call in a prescription currently right now no antibiotic indicated

## 2015-12-21 ENCOUNTER — Telehealth: Payer: Self-pay | Admitting: Family Medicine

## 2015-12-21 ENCOUNTER — Telehealth: Payer: Self-pay | Admitting: *Deleted

## 2015-12-21 ENCOUNTER — Other Ambulatory Visit: Payer: Self-pay | Admitting: *Deleted

## 2015-12-21 MED ORDER — AZITHROMYCIN 250 MG PO TABS
ORAL_TABLET | ORAL | Status: DC
Start: 2015-12-21 — End: 2016-02-21

## 2015-12-21 NOTE — Telephone Encounter (Signed)
Left message to return call 

## 2015-12-21 NOTE — Telephone Encounter (Signed)
Med sent to pharm. Pt notified on voicemail.  

## 2015-12-21 NOTE — Telephone Encounter (Signed)
Pt called stating that she still has a sore throat and would like something to be called in.      [facility] [place]

## 2015-12-21 NOTE — Telephone Encounter (Signed)
Z-Pak as directed. Follow-up if ongoing troubles

## 2015-12-21 NOTE — Telephone Encounter (Signed)
Message taken in wrong chart.

## 2015-12-21 NOTE — Telephone Encounter (Signed)
Pt seen 7/3 for sore throat. Strep test negative. Pt states she was told to call back if no better and antibiotic would be called in. Pt states she is still having sore throat. No other symptoms. No fever, no trouble breathing. [facility] [place].

## 2016-02-21 ENCOUNTER — Ambulatory Visit (INDEPENDENT_AMBULATORY_CARE_PROVIDER_SITE_OTHER): Payer: 59 | Admitting: Family Medicine

## 2016-02-21 ENCOUNTER — Encounter: Payer: Self-pay | Admitting: Family Medicine

## 2016-02-21 VITALS — BP 114/72 | Temp 98.1°F | Ht 66.5 in | Wt 265.4 lb

## 2016-02-21 DIAGNOSIS — H6501 Acute serous otitis media, right ear: Secondary | ICD-10-CM | POA: Diagnosis not present

## 2016-02-21 DIAGNOSIS — J329 Chronic sinusitis, unspecified: Secondary | ICD-10-CM | POA: Diagnosis not present

## 2016-02-21 DIAGNOSIS — J310 Chronic rhinitis: Secondary | ICD-10-CM

## 2016-02-21 MED ORDER — AMOXICILLIN-POT CLAVULANATE 875-125 MG PO TABS: 1.0000 | ORAL_TABLET | Freq: Two times a day (BID) | ORAL | 0 refills | Status: AC

## 2016-02-21 MED ORDER — TRIAMCINOLONE ACETONIDE 0.1 % EX CREA: 1.0000 "application " | TOPICAL_CREAM | Freq: Two times a day (BID) | CUTANEOUS | 2 refills | Status: DC

## 2016-02-21 NOTE — Progress Notes (Signed)
   Subjective:    Patient ID: Carolyn HeinrichJazmin B Atkinson, female    DOB: 05/16/1991, 25 y.o.   MRN: [id number]  Sinusitis  This is a new problem. The current episode started in the past 7 days. Associated symptoms include ear pain and a sore throat. (Runny nose ) Past treatments include oral decongestants.   Cough and runny nose  Now cough and headache and pressure  Right ear cannot hear muffled  Cant heat hear  No major hx of om as child  Wondered if may be ruptured but no disch   Patient states no other concerns this visit.   Review of Systems  HENT: Positive for ear pain and sore throat.        Objective:   Physical Exam Alert decent hydration vital stable right otitis media positive frontal maxillary tenderness pharynx normal neck supple lungs clear       Assessment & Plan:  Impression rhinosinusitis with right otitis media plan antibiotics prescribed symptom care discussed warning signs discussed WSL

## 2016-03-03 ENCOUNTER — Encounter (HOSPITAL_COMMUNITY): Payer: Self-pay | Admitting: Emergency Medicine

## 2016-03-03 ENCOUNTER — Emergency Department (HOSPITAL_COMMUNITY)
Admission: EM | Admit: 2016-03-03 | Discharge: 2016-03-03 | Disposition: A | Discharge status: Home / Self Care | Payer: 59 | Attending: Emergency Medicine | Admitting: Emergency Medicine

## 2016-03-03 DIAGNOSIS — Z792 Long term (current) use of antibiotics: Secondary | ICD-10-CM | POA: Diagnosis not present

## 2016-03-03 DIAGNOSIS — T7840XA Allergy, unspecified, initial encounter: Secondary | ICD-10-CM

## 2016-03-03 DIAGNOSIS — R21 Rash and other nonspecific skin eruption: Secondary | ICD-10-CM

## 2016-03-03 DIAGNOSIS — T781XXA Other adverse food reactions, not elsewhere classified, initial encounter: Secondary | ICD-10-CM | POA: Diagnosis not present

## 2016-03-03 DIAGNOSIS — Z79899 Other long term (current) drug therapy: Secondary | ICD-10-CM | POA: Diagnosis not present

## 2016-03-03 DIAGNOSIS — L299 Pruritus, unspecified: Secondary | ICD-10-CM | POA: Diagnosis present

## 2016-03-03 MED ORDER — FAMOTIDINE 20 MG PO TABS: 20.0000 mg | ORAL_TABLET | Freq: Two times a day (BID) | ORAL | 0 refills | Status: DC

## 2016-03-03 MED ORDER — PREDNISONE 10 MG PO TABS: ORAL_TABLET | ORAL | 0 refills | Status: DC

## 2016-03-03 MED ORDER — FAMOTIDINE 20 MG PO TABS
20.0000 mg | ORAL_TABLET | Freq: Once | ORAL | Status: AC
  Administered 2016-03-03: 20 mg via ORAL
  Filled 2016-03-03: qty 1

## 2016-03-03 MED ORDER — DEXAMETHASONE SODIUM PHOSPHATE 10 MG/ML IJ SOLN
10.0000 mg | Freq: Once | INTRAMUSCULAR | Status: AC
  Administered 2016-03-03: 10 mg via INTRAMUSCULAR
  Filled 2016-03-03: qty 1

## 2016-03-03 MED ORDER — DIPHENHYDRAMINE HCL 25 MG PO CAPS
25.0000 mg | ORAL_CAPSULE | Freq: Once | ORAL | Status: DC
  Filled 2016-03-03: qty 1

## 2016-03-03 MED ORDER — DIPHENHYDRAMINE HCL 25 MG PO CAPS
25.0000 mg | ORAL_CAPSULE | Freq: Once | ORAL | Status: AC
  Administered 2016-03-03: 25 mg via ORAL
  Filled 2016-03-03: qty 1

## 2016-03-03 MED ORDER — DIPHENHYDRAMINE HCL 25 MG PO CAPS: 25.0000 mg | ORAL_CAPSULE | Freq: Four times a day (QID) | ORAL | 0 refills | Status: DC | PRN

## 2016-03-03 NOTE — Discharge Instructions (Signed)
As discussed,  it is unclear what the source of your rash is but appears to be an allergic reaction, possibly the mushroom you ate yesterday.  You should start taking your prednisone taper tomorrow as instructed along with the benadryl and the pepcid.  Follow up with your doctor if needed for persistent symptoms.  Return here sooner for any worsened symptoms including worsened rash, shortness of breath or any new symptoms.

## 2016-03-03 NOTE — ED Triage Notes (Signed)
Pt reports she thinks she may have had an allergic reaction. Broke out in a rash yesterday. Red raised bumps on hands, between fingers, pt states 'all over."

## 2016-03-05 NOTE — ED Provider Notes (Signed)
[facility]EMER[facility]Y DEPT Provider Note   CSN: [id number] Arrival date & time: 03/03/16  [id number]     History   Chief Complaint Chief Complaint  Patient presents with  . Rash    HPI Carolyn Atkinson is a 25 y.o. female presenting with a pruritic rash which started yesterday several hours after eating a mushroom which she identifies as the only possible exposure.  She sauteed mushrooms (store bought) and only ate one, but within an hour she started itching on her hands and forearms, followed by rash which continues to spread.  She has had mushrooms in the past but is unsure if she has had this particular variety (cannot name the type).  She denies face, mouth, tongue or throat swelling, sob, cough, wheezing. She has taken benadryl with transient relief of itching.  She has found no other alleviators. No other household members or contacts with similar sx. She denies migratory nature of rash.  She completed a course of augmentin yesterday for treatment of an ear infection, but has had this medicine in the past with no allergic sx or rash.  The history is provided by the patient and the spouse.    Past Medical History:  Diagnosis Date  . [id number](956.2Headache(784.0)     Patient Active Problem List   Diagnosis Date Noted  . Migraine without aura and without status migrainosus, not intractable 11/05/2014  . Prediabetes 10/06/2014    Past Surgical History:  Procedure Laterality Date  . LEEP    . TUBAL LIGATION      OB History    Gravida Para Term Preterm AB Living   2 2 1 1   2    SAB TAB Ectopic Multiple Live Births           2       Home Medications    Prior to Admission medications   Medication Sig Start Date End Date Taking? Authorizing Provider  amoxicillin-clavulanate (AUGMENTIN) 875-125 MG tablet Take 1 tablet by mouth 2 (two) times daily. 02/21/16 03/06/16  Merlyn AlbertWilliam S Luking, MD  diphenhydrAMINE (BENADRYL) 25 mg capsule Take 1 capsule (25 mg total) by mouth every 6 (six) hours as needed.  03/03/16   Burgess AmorJulie Coby Antrobus, PA-C  famotidine (PEPCID) 20 MG tablet Take 1 tablet (20 mg total) by mouth 2 (two) times daily. 03/03/16   Burgess AmorJulie Legrand Lasser, PA-C  predniSONE (DELTASONE) 10 MG tablet 6, 5, 4, 3, 2 then 1 tablet by mouth daily for 6 days total. 03/03/16   Burgess AmorJulie Azizah Lisle, PA-C  triamcinolone cream (KENALOG) 0.1 % Apply 1 application topically 2 (two) times daily. 02/21/16   Merlyn AlbertWilliam S Luking, MD    Family History Family History  Problem Relation Age of Onset  . Cancer Mother   . Stroke Father   . Hypertension Maternal Grandmother   . Diabetes Maternal Grandmother   . Cancer Maternal Grandmother   . Heart disease Neg Hx     Social History Social History  Substance Use Topics  . Smoking status: Never Smoker  . Smokeless tobacco: Never Used  . Alcohol use No     Allergies   Other   Review of Systems Review of Systems  Constitutional: Negative for chills and fever.  HENT: Negative for congestion, facial swelling and trouble swallowing.   Respiratory: Negative for chest tightness, shortness of breath and wheezing.   Skin: Positive for rash.  Neurological: Negative for numbness.     Physical Exam Updated Vital Signs BP 129/84 (BP Location: Left Arm)  Pulse 76   Temp 98.5 F (36.9 C) (Oral)   Resp 18   Ht 5\' 6"  (1.676 m)   Wt 120.2 kg   LMP 02/29/2016   SpO2 99%   BMI 42.77 kg/m   Physical Exam  Constitutional: She appears well-developed and well-nourished. No distress.  HENT:  Head: Normocephalic.  Neck: Neck supple.  Cardiovascular: Normal rate.   Pulmonary/Chest: Effort normal. No stridor. She has no wheezes.  Musculoskeletal: Normal range of motion. She exhibits no edema.  Skin: Rash noted. Rash is maculopapular. Rash is not pustular and not vesicular.  Rash noted arms, trunk and neck.     ED Treatments / Results  Labs (all labs ordered are listed, but only abnormal results are displayed) Labs Reviewed - No data to display  EKG  EKG Interpretation None        Radiology No results found.  Procedures Procedures (including critical care time)  Medications Ordered in ED Medications  dexamethasone (DECADRON) injection 10 mg (10 mg Intramuscular Given 03/03/16 2222)  famotidine (PEPCID) tablet 20 mg (20 mg Oral Given 03/03/16 2221)  diphenhydrAMINE (BENADRYL) capsule 25 mg (25 mg Oral Given 03/03/16 2221)     Initial Impression / Assessment and Plan / ED Course  I have reviewed the triage vital signs and the nursing notes.  Pertinent labs & imaging results that were available during my care of the patient were reviewed by me and considered in my medical decision making (see chart for details).  Clinical Course    Allergic reaction without respiratory involvement. Possibly food related (mushrooms), less likely augmentin given no sx in the past with this medicine.  She was placed on pepcid, benadryl, prednisone taper to begin tomorrow, given decadron injection tonight.  Advised recheck with pcp for any lingering sx, recheck here if sx worsen.   Final Clinical Impressions(s) / ED Diagnoses   Final diagnoses:  Rash  Allergic reaction, initial encounter    New Prescriptions Discharge Medication List as of 03/03/2016 11:05 PM    START taking these medications   Details  diphenhydrAMINE (BENADRYL) 25 mg capsule Take 1 capsule (25 mg total) by mouth every 6 (six) hours as needed., Starting Sun 03/03/2016, Print    famotidine (PEPCID) 20 MG tablet Take 1 tablet (20 mg total) by mouth 2 (two) times daily., Starting Sun 03/03/2016, Print    predniSONE (DELTASONE) 10 MG tablet 6, 5, 4, 3, 2 then 1 tablet by mouth daily for 6 days total., Print         Burgess Amor, PA-C 03/05/16 1348    Lavera Guise, MD 03/06/16 [phone]

## 2016-06-19 ENCOUNTER — Telehealth: Payer: Self-pay | Admitting: Family Medicine

## 2016-06-19 ENCOUNTER — Telehealth: Payer: Self-pay | Admitting: Pediatrics

## 2016-06-19 MED ORDER — FLUCONAZOLE 150 MG PO TABS: ORAL_TABLET | ORAL | 0 refills | Status: DC

## 2016-06-19 NOTE — Telephone Encounter (Signed)
LMTCB

## 2016-06-19 NOTE — Telephone Encounter (Signed)
[phone]

## 2016-06-19 NOTE — Telephone Encounter (Signed)
Patient called back. She states she has vaginal itching-yeast infection. She admits to frequent h/o yeast.  [facility] [address] [address]. Rx sent.

## 2016-06-19 NOTE — Telephone Encounter (Signed)
Rx for Diflucan was sent to incorrect pharmacy. [facility] [place] [facility].

## 2017-05-12 ENCOUNTER — Encounter: Payer: Self-pay | Admitting: Family Medicine

## 2017-09-13 ENCOUNTER — Other Ambulatory Visit: Payer: Self-pay

## 2017-09-13 ENCOUNTER — Emergency Department (HOSPITAL_COMMUNITY)
Admission: EM | Admit: 2017-09-13 | Discharge: 2017-09-13 | Disposition: A | Discharge status: Home / Self Care | Attending: Emergency Medicine | Admitting: Emergency Medicine

## 2017-09-13 ENCOUNTER — Encounter (HOSPITAL_COMMUNITY): Payer: Self-pay | Admitting: *Deleted

## 2017-09-13 ENCOUNTER — Emergency Department (HOSPITAL_COMMUNITY)

## 2017-09-13 DIAGNOSIS — W109XXA Fall (on) (from) unspecified stairs and steps, initial encounter: Secondary | ICD-10-CM | POA: Diagnosis not present

## 2017-09-13 DIAGNOSIS — Y9301 Activity, walking, marching and hiking: Secondary | ICD-10-CM | POA: Diagnosis not present

## 2017-09-13 DIAGNOSIS — Y929 Unspecified place or not applicable: Secondary | ICD-10-CM | POA: Insufficient documentation

## 2017-09-13 DIAGNOSIS — Z79899 Other long term (current) drug therapy: Secondary | ICD-10-CM | POA: Insufficient documentation

## 2017-09-13 DIAGNOSIS — S92355A Nondisplaced fracture of fifth metatarsal bone, left foot, initial encounter for closed fracture: Secondary | ICD-10-CM | POA: Insufficient documentation

## 2017-09-13 DIAGNOSIS — Y990 Civilian activity done for income or pay: Secondary | ICD-10-CM | POA: Insufficient documentation

## 2017-09-13 DIAGNOSIS — W19XXXA Unspecified fall, initial encounter: Secondary | ICD-10-CM

## 2017-09-13 LAB — POC URINE PREG, ED: Preg Test, Ur: NEGATIVE

## 2017-09-13 MED ORDER — HYDROCODONE-ACETAMINOPHEN 5-325 MG PO TABS: 1.0000 | ORAL_TABLET | Freq: Four times a day (QID) | ORAL | 0 refills | Status: DC | PRN

## 2017-09-13 MED ORDER — HYDROCODONE-ACETAMINOPHEN 5-325 MG PO TABS
2.0000 | ORAL_TABLET | Freq: Once | ORAL | Status: AC
  Administered 2017-09-13: 2 via ORAL
  Filled 2017-09-13: qty 2

## 2017-09-13 NOTE — ED Provider Notes (Signed)
[facility] EMERGENCY DEPARTMENT Provider Note   CSN: [id number] Arrival date & time: 09/13/17  1528     History   Chief Complaint Chief Complaint  Patient presents with  . Foot Injury    HPI Curstin B Mitschke is a 27 y.o. female who presents for evaluation of left foot pain that began this afternoon after mechanical fall.  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  Patient reports that this caused her to lose her balance and fall down the last step.  Patient reports that she had an inversion ankle injury.  Patient reports that she was able to get up and slightly bear weight on the left lower extremity after the incident.  Patient comes emergency department complaining of persistent pain to the lateral aspect of the left foot.  Patient reports she has not taking medications for the pain.  Patient denies any fevers, numbness/weakness, ankle pain, knee pain.  Patient reports she did not hit her head or have any LOC.  The history is provided by the patient.    Past Medical History:  Diagnosis Date  . WUJWJXBJ(478.2)     Patient Active Problem List   Diagnosis Date Noted  . Migraine without aura and without status migrainosus, not intractable 11/05/2014  . Prediabetes 10/06/2014    Past Surgical History:  Procedure Laterality Date  . LEEP    . TUBAL LIGATION       OB History    Gravida  2   Para  2   Term  1   Preterm  1   AB      Living  2     SAB      TAB      Ectopic      Multiple      Live Births  2            Home Medications    Prior to Admission medications   Medication Sig Start Date End Date Taking? Authorizing Provider  diphenhydrAMINE (BENADRYL) 25 mg capsule Take 1 capsule (25 mg total) by mouth every 6 (six) hours as needed. 03/03/16   Burgess Amor, PA-C  famotidine (PEPCID) 20 MG tablet Take 1 tablet (20 mg  total) by mouth 2 (two) times daily. 03/03/16   Burgess Amor, PA-C  fluconazole (DIFLUCAN) 150 MG tablet Take 1 tablet by mouth now, then take 1 tablet in 3 days. 06/19/16   Merlyn Albert, MD  HYDROcodone-acetaminophen (NORCO/VICODIN) 5-325 MG tablet Take 1-2 tablets by mouth every 6 (six) hours as needed. 09/13/17   Maxwell Caul, PA-C  predniSONE (DELTASONE) 10 MG tablet 6, 5, 4, 3, 2 then 1 tablet by mouth daily for 6 days total. 03/03/16   Idol, Raynelle Fanning, PA-C  triamcinolone cream (KENALOG) 0.1 % Apply 1 application topically 2 (two) times daily. 02/21/16   Merlyn Albert, MD    Family History Family History  Problem Relation Age of Onset  . Cancer Mother   . Stroke Father   . Hypertension Maternal Grandmother   . Diabetes Maternal Grandmother   . Cancer Maternal Grandmother   . Heart disease Neg Hx     Social History Social History   Tobacco Use  . Smoking status: Never Smoker  . Smokeless tobacco: Never Used  Substance Use Topics  . Alcohol use: No    Alcohol/week: 0.0  oz  . Drug use: No     Allergies   Other   Review of Systems Review of Systems  Musculoskeletal:       Left foot pain.  Neurological: Negative for weakness and numbness.     Physical Exam Updated Vital Signs BP 137/79 (BP Location: Right Arm)   Pulse 78   Temp 97.9 F (36.6 C) (Oral)   Resp 18   Ht 5' 6.5" (1.689 m)   Wt 117.9 kg (260 lb)   LMP 09/13/2017   SpO2 98%   BMI 41.34 kg/m   Physical Exam  Constitutional: She appears well-developed and well-nourished.  HENT:  Head: Normocephalic and atraumatic.  Eyes: Conjunctivae and EOM are normal. Right eye exhibits no discharge. Left eye exhibits no discharge. No scleral icterus.  Cardiovascular:  Pulses:      Dorsalis pedis pulses are 2+ on the right side, and 2+ on the left side.  Pulmonary/Chest: Effort normal.  Musculoskeletal:  Tenderness palpation noted to the lateral aspect of the left foot that that extends over to the  dorsal aspect of the left foot at the fourth and fifth metatarsal.  No deformity or crepitus noted.  There is some mild overlying soft tissue swelling.  No warmth, erythema noted.  No tenderness palpation to lateral or medial malleolus of the left ankle.  Dorsiflexion and plantarflexion are intact but with some subjective reports of pain.  Patient able to move all 5 toes of left foot.  No tenderness palpation to distal tib-fib, knee.  No abnormalities of the right lower extremity.  Neurological: She is alert.  Sensation intact along major nerve distributions of BLE  Skin: Skin is warm and dry. Capillary refill takes less than 2 seconds.  Good distal cap refill. LLE is not dusky in appearance or cool to touch.  Psychiatric: She has a normal mood and affect. Her speech is normal and behavior is normal.  Nursing note and vitals reviewed.    ED Treatments / Results  Labs (all labs ordered are listed, but only abnormal results are displayed) Labs Reviewed  POC URINE PREG, ED  POC URINE PREG, ED    EKG None  Radiology Dg Foot Complete Left  Result Date: 09/13/2017 CLINICAL DATA:  Status post fall with left foot pain EXAM: LEFT FOOT - COMPLETE 3+ VIEW COMPARISON:  None. FINDINGS: There is nondisplaced fracture proximal left fifth metatarsal shaft. There is no dislocation. The soft tissues are normal. IMPRESSION: Nondisplaced fracture of the proximal left fifth metatarsal shaft. Electronically Signed   By: Sherian ReinWei-Chen  Lin M.D.   On: 09/13/2017 16:33    Procedures Procedures (including critical care time)  Medications Ordered in ED Medications  HYDROcodone-acetaminophen (NORCO/VICODIN) 5-325 MG per tablet 2 tablet (has no administration in time range)     Initial Impression / Assessment and Plan / ED Course  I have reviewed the triage vital signs and the nursing notes.  Pertinent labs & imaging results that were available during my care of the patient were reviewed by me and considered in  my medical decision making (see chart for details).     27 year old female who presents for evaluation of left foot pain after mechanical injury that occurred this afternoon while walking.  Patient reports an inversion injury to the left foot.  Has been able to ambulate and barely bear weight on the left lower extremity since the incident.  No numbness/weakness. Patient is afebrile, non-toxic appearing, sitting comfortably on examination table. Vital signs reviewed and  stable. Patient is neurovascularly intact.  Patient with pain to the lateral aspect of the left foot.  No deformity or crepitus noted.  Pain extends over to the dorsal aspect of the fourth and fifth metatarsal.  Consider fracture versus dislocation.  History/physical exam is not concerning for ankle sprain, fracture, dislocation.  X-rays ordered at triage.  X-rays reviewed.  They show a nondisplaced fracture of the proximal left fifth metatarsal shaft.  Will plan to place patient in a CAM walker boot.  Patient instructed to follow-up with orthopedics on an outpatient basis. Patient had ample opportunity for questions and discussion. All patient's questions were answered with full understanding. Strict return precautions discussed. Patient expresses understanding and agreement to plan.   Final Clinical Impressions(s) / ED Diagnoses   Final diagnoses:  Closed nondisplaced fracture of fifth metatarsal bone of left foot, initial encounter    ED Discharge Orders        Ordered    HYDROcodone-acetaminophen (NORCO/VICODIN) 5-325 MG tablet  Every 6 hours PRN     09/13/17 1745       Maxwell Caul, PA-C 09/13/17 1749    Nira Conn, MD 09/14/17 1318

## 2017-09-13 NOTE — ED Triage Notes (Signed)
The pt is c/o lt foot pain  She tripped and fell down some steps  When a cat jumped from a bush and startled  She was walking on her route from [place] [employer]  lmp today  Good pedal pulse swollen laterally

## 2017-09-13 NOTE — Discharge Instructions (Signed)
You can take Tylenol or Ibuprofen as directed for pain. You can alternate Tylenol and Ibuprofen every 4 hours. If you take Tylenol at 1pm, then you can take Ibuprofen at 5pm. Then you can take Tylenol again at 9pm.  For severe or breakthrough pain.  Wear the Cam walker boot until follow-up with orthopedic doctor as directed. Follow the RICE (Rest, Ice, Compression, Elevation) protocol as directed.    Call their office and arrange for an appointment in the next few days.  Return to the emergency department for worsening pain, redness or swelling of the foot, fevers, numbness/weakness or any other worsening or concerning symptoms.

## 2017-09-13 NOTE — ED Notes (Signed)
Ice pack placed

## 2018-03-08 ENCOUNTER — Ambulatory Visit (INDEPENDENT_AMBULATORY_CARE_PROVIDER_SITE_OTHER): Payer: 59

## 2018-03-08 ENCOUNTER — Ambulatory Visit (HOSPITAL_COMMUNITY)
Admission: EM | Admit: 2018-03-08 | Discharge: 2018-03-08 | Disposition: A | Discharge status: Home / Self Care | Payer: 59 | Attending: Urgent Care | Admitting: Urgent Care

## 2018-03-08 ENCOUNTER — Encounter (HOSPITAL_COMMUNITY): Payer: Self-pay | Admitting: *Deleted

## 2018-03-08 DIAGNOSIS — R079 Chest pain, unspecified: Secondary | ICD-10-CM | POA: Diagnosis not present

## 2018-03-08 DIAGNOSIS — L509 Urticaria, unspecified: Secondary | ICD-10-CM

## 2018-03-08 MED ORDER — MELOXICAM 7.5 MG PO TABS: 7.5000 mg | ORAL_TABLET | Freq: Every day | ORAL | 0 refills | Status: DC

## 2018-03-08 MED ORDER — HYDROXYZINE HCL 25 MG PO TABS: 12.5000 mg | ORAL_TABLET | Freq: Three times a day (TID) | ORAL | 0 refills | Status: DC | PRN

## 2018-03-08 MED ORDER — RANITIDINE HCL 150 MG PO TABS: 150.0000 mg | ORAL_TABLET | Freq: Two times a day (BID) | ORAL | 0 refills | Status: DC

## 2018-03-08 NOTE — ED Provider Notes (Addendum)
MRN: [id number] DOB: [birth year]  Subjective:   Carolyn Atkinson is a 27 y.o. female presenting for 1 day history of mid-sternal chest pain, pleuritic pain, shob. Pain is constant, tightness/sharp in nature, worse with movement, relieved with bending forward. Denies fever, cough, n/v, abdominal pain, trauma, falls, car accident, heavy lifting. Denies smoking cigarettes. Denies history of asthma. Has not tried medications for relief and is not currently taking any medications. Patient's mother has WPW syndrome. Family history is also positive for heart disease.  Patient also reports that she has had a rash the past 2 to 3 days that is itchy over her right upper lateral thigh.  New exposures including poisonous plants, new detergents, new hygiene products.   Allergies  Allergen Reactions  . Toradol [Ketorolac Tromethamine]   . Other Hives and Rash    Injection-Name is unknown - when pregnant    Past Medical History:  Diagnosis Date  . [id number](440.1)      Past Surgical History:  Procedure Laterality Date  . LEEP    . TUBAL LIGATION      Objective:   Vitals: BP 117/72   Pulse (!) 59   Temp 98.1 F (36.7 C) (Oral)   Resp 16   LMP 03/06/2018 (Exact Date)   SpO2 100%   Physical Exam  Constitutional: She is oriented to person, place, and time. She appears well-developed and well-nourished.  Eyes: Right eye exhibits no discharge. Left eye exhibits no discharge. No scleral icterus.  Cardiovascular: Normal rate, regular rhythm, normal heart sounds and intact distal pulses. Exam reveals no gallop and no friction rub.  No murmur heard. Pulmonary/Chest: Effort normal and breath sounds normal. No stridor. No respiratory distress. She has no wheezes. She has no rales. She exhibits no tenderness.  Neurological: She is alert and oriented to person, place, and time.  Skin: Skin is warm and dry.      ED ECG REPORT   Date: 03/08/2018  Rate: 57 bpm  Rhythm: sinus bradycardia  QRS Axis:  normal  Intervals: normal  ST/T Wave abnormalities: normal  Conduction Disutrbances:none  Narrative Interpretation: Sinus bradycardia.  Nonspecific T wave inversion in V1 and T wave flattening in lead III.  Old EKG Reviewed: none available  I have personally reviewed the EKG tracing and agree with the computerized printout as noted.  Assessment and Plan :   Nonspecific chest pain  Urticaria  Try Zantac for reflux.  Use Vistaril for urticaria of unclear etiology.  Counseled patient on differential, most likely to have GERD versus costochondritis.  Also counseled on possibility of chest PE versus other acute cardiopulmonary process.  Will use meloxicam to address musculoskeletal type pain, counseled on possibility of allergic reaction given her history of allergies to Toradol.  ER and return to clinic precautions reviewed.  Patient will try and establish with PCP for further work-up.   Wallis Bamberg, PA-C 03/08/18 1329   UPDATE:  Dg Chest 2 View  Result Date: 03/08/2018 CLINICAL DATA:  Chest pain EXAM: CHEST - 2 VIEW COMPARISON:  None. FINDINGS: Lungs are clear. Heart size and pulmonary vascularity are normal. No adenopathy. No pneumothorax. No bone lesions. There is mild lower thoracic dextroscoliosis. IMPRESSION: No edema or consolidation. Electronically Signed   By: Bretta Bang III M.D.   On: 03/08/2018 13:57   She was instructed that we would do the chest x-ray and she could go home due to the long wait time for today's reports.  Radiology reports that this very limited and  therefore we advised that she would be called if the chest x-ray was abnormal.  Report is very reassuring.  Will continue with management as previously discussed with patient.   Wallis Bamberg, PA-C 03/08/18 1420

## 2018-03-08 NOTE — ED Notes (Signed)
EKG given to [facility]

## 2018-03-08 NOTE — ED Triage Notes (Signed)
C/O right-to-mid chest pain starting yesterday; pain worse with deep breathing, movements, palpation.  States pain improves when bending forward.

## 2018-06-01 ENCOUNTER — Ambulatory Visit (HOSPITAL_COMMUNITY)
Admission: EM | Admit: 2018-06-01 | Discharge: 2018-06-01 | Disposition: A | Discharge status: Home / Self Care | Payer: 59 | Attending: Family Medicine | Admitting: Family Medicine

## 2018-06-01 ENCOUNTER — Other Ambulatory Visit: Payer: Self-pay

## 2018-06-01 DIAGNOSIS — R112 Nausea with vomiting, unspecified: Secondary | ICD-10-CM | POA: Diagnosis not present

## 2018-06-01 DIAGNOSIS — R197 Diarrhea, unspecified: Secondary | ICD-10-CM | POA: Diagnosis not present

## 2018-06-01 MED ORDER — ONDANSETRON 4 MG PO TBDP: 4.0000 mg | ORAL_TABLET | Freq: Three times a day (TID) | ORAL | 0 refills | Status: DC | PRN

## 2018-06-01 NOTE — Discharge Instructions (Signed)

## 2018-06-01 NOTE — ED Triage Notes (Signed)
Pt cc fever and it want break. Pt states she ha diarrhea x 3 days.

## 2018-06-04 NOTE — ED Provider Notes (Signed)
[facility]    CSN: [id number] Arrival date & time: 06/01/18  1528     History   Chief Complaint Chief Complaint  Patient presents with  . Fever    HPI Carolyn Atkinson is a 27 y.o. female no significant past medical history presenting today for evaluation of fever, diarrhea.  Patient states that for the past 3 days she has noticed a low-grade fever.  She has been taking Tylenol and ibuprofen and fever keeps returning after medicine wearing off.  She has had associated diarrhea as well as nausea and vomiting.  She has had a small cough and some mild congestion, but her main concern is the GI upset.  She has had decreased appetite.  Has been able to tolerate liquids.  Vomiting has decreased in frequency some, but has had difficulty tolerating solids.  Denies blood in vomit or stool.  Mild associated abdominal pain.  HPI  Past Medical History:  Diagnosis Date  . ZHYQMVHQ(469.6)     Patient Active Problem List   Diagnosis Date Noted  . Migraine without aura and without status migrainosus, not intractable 11/05/2014  . Prediabetes 10/06/2014    Past Surgical History:  Procedure Laterality Date  . LEEP    . TUBAL LIGATION      OB History    Gravida  2   Para  2   Term  1   Preterm  1   AB      Living  2     SAB      TAB      Ectopic      Multiple      Live Births  2            Home Medications    Prior to Admission medications   Medication Sig Start Date End Date Taking? Authorizing Provider  hydrOXYzine (ATARAX/VISTARIL) 25 MG tablet Take 0.5-1 tablets (12.5-25 mg total) by mouth every 8 (eight) hours as needed for itching. 03/08/18   Wallis Bamberg, PA-C  meloxicam (MOBIC) 7.5 MG tablet Take 1 tablet (7.5 mg total) by mouth daily. 03/08/18   Wallis Bamberg, PA-C  ondansetron (ZOFRAN ODT) 4 MG disintegrating tablet Take 1 tablet (4 mg total) by mouth every 8 (eight) hours as needed for nausea or vomiting. 06/01/18   Tishawn Friedhoff C, PA-C    ranitidine (ZANTAC) 150 MG tablet Take 1 tablet (150 mg total) by mouth 2 (two) times daily. 03/08/18   Wallis Bamberg, PA-C    Family History Family History  Problem Relation Age of Onset  . Cancer Mother   . Stroke Father   . Hypertension Maternal Grandmother   . Diabetes Maternal Grandmother   . Cancer Maternal Grandmother   . Heart disease Neg Hx     Social History Social History   Tobacco Use  . Smoking status: Never Smoker  . Smokeless tobacco: Never Used  Substance Use Topics  . Alcohol use: No  . Drug use: Never     Allergies   Toradol [ketorolac tromethamine] and Other   Review of Systems Review of Systems  Constitutional: Positive for appetite change and fever. Negative for activity change, chills and fatigue.  HENT: Positive for congestion and rhinorrhea. Negative for ear pain, sinus pressure, sore throat and trouble swallowing.   Eyes: Negative for discharge and redness.  Respiratory: Positive for cough. Negative for chest tightness and shortness of breath.   Cardiovascular: Negative for chest pain.  Gastrointestinal: Positive for diarrhea, nausea and  vomiting. Negative for abdominal pain, blood in stool and constipation.  Musculoskeletal: Negative for myalgias.  Skin: Negative for rash.  Neurological: Negative for dizziness, light-headedness and headaches.     Physical Exam Triage Vital Signs ED Triage Vitals  Enc Vitals Group     BP 06/01/18 1639 113/66     Pulse Rate 06/01/18 1639 (!) 105     Resp 06/01/18 1639 18     Temp 06/01/18 1639 100 F (37.8 C)     Temp Source 06/01/18 1639 Oral     SpO2 06/01/18 1639 100 %     Weight 06/01/18 1642 240 lb (108.9 kg)     Height --      Head Circumference --      Peak Flow --      Pain Score 06/01/18 1642 6     Pain Loc --      Pain Edu? --      Excl. in GC? --    No data found.  Updated Vital Signs BP 113/66 (BP Location: Right Arm)   Pulse (!) 105   Temp 100 F (37.8 C) (Oral)   Resp 18   Wt  240 lb (108.9 kg)   LMP 05/25/2018   SpO2 100%   BMI 38.16 kg/m   Visual Acuity Right Eye Distance:   Left Eye Distance:   Bilateral Distance:    Right Eye Near:   Left Eye Near:    Bilateral Near:     Physical Exam Vitals signs and nursing note reviewed.  Constitutional:      General: She is not in acute distress.    Appearance: She is well-developed.  HENT:     Head: Normocephalic and atraumatic.     Ears:     Comments: Bilateral ears without tenderness to palpation of external auricle, tragus and mastoid, EAC's without erythema or swelling, TM's with good bony landmarks and cone of light. Non erythematous.     Mouth/Throat:     Comments: Oral mucosa pink and moist, no tonsillar enlargement or exudate. Posterior pharynx patent and nonerythematous, no uvula deviation or swelling. Normal phonation. Eyes:     Conjunctiva/sclera: Conjunctivae normal.  Neck:     Musculoskeletal: Neck supple.  Cardiovascular:     Rate and Rhythm: Normal rate and regular rhythm.     Heart sounds: No murmur.  Pulmonary:     Effort: Pulmonary effort is normal. No respiratory distress.     Breath sounds: Normal breath sounds.     Comments: Breathing comfortably at rest, CTABL, no wheezing, rales or other adventitious sounds auscultated Abdominal:     Palpations: Abdomen is soft.     Tenderness: There is no abdominal tenderness.     Comments: Abdomen soft, nondistended.  Nontender to light deep palpation throughout abdomen, no focal tenderness, negative rebound.  Skin:    General: Skin is warm and dry.  Neurological:     Mental Status: She is alert.      UC Treatments / Results  Labs (all labs ordered are listed, but only abnormal results are displayed) Labs Reviewed - No data to display  EKG None  Radiology No results found.  Procedures Procedures (including critical care time)  Medications Ordered in UC Medications - No data to display  Initial Impression / Assessment and  Plan / UC Course  I have reviewed the triage vital signs and the nursing notes.  Pertinent labs & imaging results that were available during my care of the patient  were reviewed by me and considered in my medical decision making (see chart for details).     Patient with low-grade fever, acute onset of nausea vomiting and diarrhea.  Abdominal exam negative for peritoneal signs, not concerning for acute abdomen at this time.  We will continue to monitor.  Mild URI symptoms.  Possible flu, given combination will recommend symptomatic and supportive care as likely viral.  Zofran to control nausea and vomiting.  Oral rehydration.Discussed strict return precautions. Patient verbalized understanding and is agreeable with plan.  Final Clinical Impressions(s) / UC Diagnoses   Final diagnoses:  Nausea vomiting and diarrhea     Discharge Instructions     Your nausea, vomiting, and diarrhea appear to have a viral cause. Your symptoms should improve over the next week as your body continues to rid the infectious cause.  For nausea: Zofran prescribed. Begin with every 6 hours, than as you are able to hold food down, take it as needed. Start with clear liquids, then move to plain foods like bananas, rice, applesauce, toast, broth, grits, oatmeal. As those food settle okay you may transition to your normal foods. Avoid spicy and greasy foods as much as possible.  For Diarrhea: This is your body's natural way of getting rid of a virus. You may try taking 1 imodium to decrease amount of stools a day, but we do not want you to stop your diarrhea.   Preventing dehydration is key! You need to replace the fluid your body is expelling. Drink plenty of fluids, may use Pedialyte or sports drinks.   Please return if you are experiencing blood in your vomit or stool or experiencing dizziness, lightheadedness, extreme fatigue, increased abdominal pain.    ED Prescriptions    Medication Sig Dispense Auth. Provider     ondansetron (ZOFRAN ODT) 4 MG disintegrating tablet  (Status: Discontinued) Take 1 tablet (4 mg total) by mouth every 8 (eight) hours as needed for nausea or vomiting. 20 tablet Jacorion Klem C, PA-C   ondansetron (ZOFRAN ODT) 4 MG disintegrating tablet Take 1 tablet (4 mg total) by mouth every 8 (eight) hours as needed for nausea or vomiting. 20 tablet Matika Bartell, [place] C, PA-C     Controlled Substance Prescriptions [place] Controlled Substance Registry consulted? Not Applicable   Lew DawesWieters, Javell Blackburn C, [place]PA-C 06/04/18 1058

## 2018-08-29 ENCOUNTER — Encounter (HOSPITAL_COMMUNITY): Payer: Self-pay

## 2018-08-29 ENCOUNTER — Ambulatory Visit (HOSPITAL_COMMUNITY)
Admission: EM | Admit: 2018-08-29 | Discharge: 2018-08-29 | Disposition: A | Discharge status: Home / Self Care | Payer: 59 | Attending: Family Medicine | Admitting: Family Medicine

## 2018-08-29 DIAGNOSIS — L309 Dermatitis, unspecified: Secondary | ICD-10-CM | POA: Diagnosis not present

## 2018-08-29 MED ORDER — TRIAMCINOLONE ACETONIDE 0.1 % EX CREA: 1.0000 "application " | TOPICAL_CREAM | Freq: Two times a day (BID) | CUTANEOUS | 0 refills | Status: DC

## 2018-08-29 MED ORDER — ACETAMINOPHEN 500 MG PO TABS: 500.0000 mg | ORAL_TABLET | Freq: Four times a day (QID) | ORAL | 0 refills | Status: DC | PRN

## 2018-08-29 NOTE — ED Triage Notes (Signed)
Pt present a rash that has spread on both knees and elbows. Pt noticed the rash 3 days ago, pt states the rash does itch sometimes

## 2018-08-29 NOTE — ED Provider Notes (Signed)
[facility]    CSN: [id number] Arrival date & time: 08/29/18  1650     History   Chief Complaint Chief Complaint  Patient presents with  . Rash    HPI Carolyn Atkinson is a 28 y.o. female.   Carolyn Atkinson presents with complaints of rash to bilateral elbows, bilateral knees and right thigh. History of eczema which she can usually control with shea butter. This has flared up over the past few days. States it is common for her with change of temperature/season. 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 No pain. No drainage or redness. The rash does itch. States she recently had a tooth pulled and has no pain medicine for it, due to pandemic can't be seen again until 4/1.     ROS per HPI, negative if not otherwise mentioned.      Past Medical History:  Diagnosis Date  . GYIRSWNI(627.0)     Patient Active Problem List   Diagnosis Date Noted  . Migraine without aura and without status migrainosus, not intractable 11/05/2014  . Prediabetes 10/06/2014    Past Surgical History:  Procedure Laterality Date  . LEEP    . TUBAL LIGATION      OB History    Gravida  2   Para  2   Term  1   Preterm  1   AB      Living  2     SAB      TAB      Ectopic      Multiple      Live Births  2            Home Medications    Prior to Admission medications   Medication Sig Start Date End Date Taking? Authorizing Provider  acetaminophen (TYLENOL) 500 MG tablet Take 1 tablet (500 mg total) by mouth every 6 (six) hours as needed. 08/29/18   Georgetta Haber, NP  hydrOXYzine (ATARAX/VISTARIL) 25 MG tablet Take 0.5-1 tablets (12.5-25 mg total) by mouth every 8 (eight) hours as needed for itching. 03/08/18   Wallis Bamberg, PA-C  meloxicam (MOBIC) 7.5 MG tablet Take 1 tablet (7.5 mg total) by mouth daily. 03/08/18   Wallis Bamberg, PA-C  ondansetron (ZOFRAN ODT) 4 MG disintegrating tablet Take 1 tablet (4 mg total) by mouth every 8 (eight) hours as needed for nausea or vomiting. 06/01/18    Wieters, Hallie C, PA-C  ranitidine (ZANTAC) 150 MG tablet Take 1 tablet (150 mg total) by mouth 2 (two) times daily. 03/08/18   Wallis Bamberg, PA-C  triamcinolone cream (KENALOG) 0.1 % Apply 1 application topically 2 (two) times daily. 08/29/18   Georgetta Haber, NP    Family History Family History  Problem Relation Age of Onset  . Cancer Mother   . Stroke Father   . Hypertension Maternal Grandmother   . Diabetes Maternal Grandmother   . Cancer Maternal Grandmother   . Heart disease Neg Hx     Social History Social History   Tobacco Use  . Smoking status: Never Smoker  . Smokeless tobacco: Never Used  Substance Use Topics  . Alcohol use: No  . Drug use: Never     Allergies   Toradol [ketorolac tromethamine] and Other   Review of Systems Review of Systems   Physical Exam Triage Vital Signs ED Triage Vitals  Enc Vitals Group     BP 08/29/18 1715 129/75     Pulse Rate 08/29/18 1715 75  Resp 08/29/18 1715 16     Temp 08/29/18 1715 99.2 F (37.3 C)     Temp Source 08/29/18 1715 Oral     SpO2 08/29/18 1715 97 %     Weight --      Height --      Head Circumference --      Peak Flow --      Pain Score 08/29/18 1718 0     Pain Loc --      Pain Edu? --      Excl. in GC? --    No data found.  Updated Vital Signs BP 129/75 (BP Location: Right Arm)   Pulse 75   Temp 99.2 F (37.3 C) (Oral)   Resp 16   LMP 08/24/2018   SpO2 97%   Physical Exam Constitutional:      General: She is not in acute distress.    Appearance: She is well-developed.  HENT:     Head:     Comments: Tooth #19 extracted  Cardiovascular:     Rate and Rhythm: Normal rate and regular rhythm.     Heart sounds: Normal heart sounds.  Pulmonary:     Effort: Pulmonary effort is normal.     Breath sounds: Normal breath sounds.  Skin:    General: Skin is warm and dry.     Findings: Rash present.     Comments: Raised scaled rash to bilateral elbows, bilateral knees and to right thigh;  skin toned; non tender, no drainage   Neurological:     Mental Status: She is alert and oriented to person, place, and time.      UC Treatments / Results  Labs (all labs ordered are listed, but only abnormal results are displayed) Labs Reviewed - No data to display  EKG None  Radiology No results found.  Procedures Procedures (including critical care time)  Medications Ordered in UC Medications - No data to display  Initial Impression / Assessment and Plan / UC Course  I have reviewed the triage vital signs and the nursing notes.  Pertinent labs & imaging results that were available during my care of the patient were reviewed by me and considered in my medical decision making (see chart for details).     Kenalog provided. toradol allergy, tylenol provided for prn use s/p dental extraction. If symptoms worsen or do not improve in the next week to return to be seen or to follow up with PCP.    Final Clinical Impressions(s) / UC Diagnoses   Final diagnoses:  Eczema, unspecified type   Discharge Instructions   None    ED Prescriptions    Medication Sig Dispense Auth. Provider   triamcinolone cream (KENALOG) 0.1 % Apply 1 application topically 2 (two) times daily. 30 g Linus Mako B, NP   acetaminophen (TYLENOL) 500 MG tablet Take 1 tablet (500 mg total) by mouth every 6 (six) hours as needed. 30 tablet Georgetta Haber, NP     Controlled Substance Prescriptions [place] Controlled Substance Registry consulted? Not Applicable   Georgetta Haber, NP 08/29/18 1740

## 2018-10-04 ENCOUNTER — Other Ambulatory Visit: Payer: Self-pay

## 2018-10-04 ENCOUNTER — Emergency Department (HOSPITAL_COMMUNITY)
Admission: EM | Admit: 2018-10-04 | Discharge: 2018-10-05 | Disposition: A | Discharge status: Home / Self Care | Payer: 59 | Attending: Emergency Medicine | Admitting: Emergency Medicine

## 2018-10-04 ENCOUNTER — Encounter (HOSPITAL_COMMUNITY): Payer: Self-pay | Admitting: Emergency Medicine

## 2018-10-04 DIAGNOSIS — Z79899 Other long term (current) drug therapy: Secondary | ICD-10-CM | POA: Insufficient documentation

## 2018-10-04 DIAGNOSIS — R51 Headache: Secondary | ICD-10-CM | POA: Diagnosis present

## 2018-10-04 DIAGNOSIS — R519 Headache, unspecified: Secondary | ICD-10-CM

## 2018-10-04 LAB — CBC WITH DIFFERENTIAL/PLATELET
Abs Immature Granulocytes: 0.03 10*3/uL (ref 0.00–0.07)
Basophils Absolute: 0.1 10*3/uL (ref 0.0–0.1)
Basophils Relative: 0 %
Eosinophils Absolute: 0.4 10*3/uL (ref 0.0–0.5)
Eosinophils Relative: 3 %
HCT: 38.2 % (ref 36.0–46.0)
Hemoglobin: 12.2 g/dL (ref 12.0–15.0)
Immature Granulocytes: 0 %
Lymphocytes Relative: 32 %
Lymphs Abs: 3.7 10*3/uL (ref 0.7–4.0)
MCH: 28.4 pg (ref 26.0–34.0)
MCHC: 31.9 g/dL (ref 30.0–36.0)
MCV: 88.8 fL (ref 80.0–100.0)
Monocytes Absolute: 0.9 10*3/uL (ref 0.1–1.0)
Monocytes Relative: 8 %
Neutro Abs: 6.4 10*3/uL (ref 1.7–7.7)
Neutrophils Relative %: 57 %
Platelets: 291 10*3/uL (ref 150–400)
RBC: 4.3 MIL/uL (ref 3.87–5.11)
RDW: 13.3 % (ref 11.5–15.5)
WBC: 11.5 10*3/uL — ABNORMAL HIGH (ref 4.0–10.5)
nRBC: 0 % (ref 0.0–0.2)

## 2018-10-04 MED ORDER — DIPHENHYDRAMINE HCL 50 MG/ML IJ SOLN
12.5000 mg | Freq: Once | INTRAMUSCULAR | Status: AC
  Administered 2018-10-04: 12.5 mg via INTRAVENOUS
  Filled 2018-10-04: qty 1

## 2018-10-04 MED ORDER — PROCHLORPERAZINE EDISYLATE 10 MG/2ML IJ SOLN
10.0000 mg | Freq: Once | INTRAMUSCULAR | Status: AC
  Administered 2018-10-04: 23:00:00 10 mg via INTRAVENOUS
  Filled 2018-10-04: qty 2

## 2018-10-04 MED ORDER — SODIUM CHLORIDE 0.9 % IV BOLUS
1000.0000 mL | Freq: Once | INTRAVENOUS | Status: AC
  Administered 2018-10-04: 23:00:00 1000 mL via INTRAVENOUS

## 2018-10-04 MED ORDER — MECLIZINE HCL 25 MG PO TABS
25.0000 mg | ORAL_TABLET | Freq: Once | ORAL | Status: AC
  Administered 2018-10-04: 25 mg via ORAL
  Filled 2018-10-04: qty 1

## 2018-10-04 NOTE — ED Triage Notes (Signed)
Pt reports a long time hx of headache however now she is getting dizzy w/ them.  This is new over the past week.  Pt states she tried the virtual MD however she states they told her to come for a MRI. Pt has had to call off work three days last week.  Pt feels she is dehydrated.

## 2018-10-05 LAB — COMPREHENSIVE METABOLIC PANEL
ALT: 17 U/L (ref 0–44)
AST: 17 U/L (ref 15–41)
Albumin: 3.3 g/dL — ABNORMAL LOW (ref 3.5–5.0)
Alkaline Phosphatase: 80 U/L (ref 38–126)
Anion gap: 5 (ref 5–15)
BUN: 10 mg/dL (ref 6–20)
CO2: 24 mmol/L (ref 22–32)
Calcium: 8.7 mg/dL — ABNORMAL LOW (ref 8.9–10.3)
Chloride: 109 mmol/L (ref 98–111)
Creatinine, Ser: 0.91 mg/dL (ref 0.44–1.00)
GFR calc Af Amer: >60 mL/min (ref >60)
GFR calc non Af Amer: >60 mL/min (ref >60)
Glucose, Bld: 107 mg/dL — ABNORMAL HIGH (ref 70–99)
Potassium: 3.5 mmol/L (ref 3.5–5.1)
Sodium: 138 mmol/L (ref 135–145)
Total Bilirubin: 0.6 mg/dL (ref 0.3–1.2)
Total Protein: 6.3 g/dL — ABNORMAL LOW (ref 6.5–8.1)

## 2018-10-05 LAB — I-STAT BETA HCG BLOOD, ED (MC, WL, AP ONLY): I-stat hCG, quantitative: <5 m[IU]/mL (ref <5)

## 2018-10-05 MED ORDER — MECLIZINE HCL 25 MG PO TABS: 25.0000 mg | ORAL_TABLET | Freq: Three times a day (TID) | ORAL | 0 refills | Status: DC | PRN

## 2018-10-05 NOTE — Discharge Instructions (Addendum)
Please see the information and instructions below regarding your visit.  Your diagnoses today include:  1. Bad headache   2. Occipital headache     You were seen and treated in the emergency department today for headache. Fortunately, your vitals, exam, and work-up is reassuring with no apparent emergent cause for your headache at this time.  Tests performed today include: See side panel of your discharge paperwork for testing performed today. Vital signs are listed at the bottom of these instructions.   Medications prescribed:    Try to avoid daily or regular use of tylenol, aspirin, ibuprofen, and other overt-the-counter pain medications as this can contribute to rebound headaches.   Take any prescribed medications only as prescribed, and any over the counter medications only as directed on the packaging.  Please take the meclizine as needed if you experience further dizziness.  Recommend not going to work if you take it until you know how will affect you.  Home care instructions:   Drink plenty of fluids at home. This will help with your headache. Be cautious with caffeine use, as this can cause your headache to rebound when the effects wear off. If you drink more than 2 cups of coffee/caffeinated tea, or caffeinated soda per day, I suggest you wean down that amount.  Please follow any educational materials contained in this packet.   Follow-up instructions: Please follow-up with Neurology.   Return instructions:  Please return to the Emergency Department if you experience worsening symptoms. It is VERY important that you monitor your symptoms at home. If you develop worsening headache, new fever, new neck stiffness, rash, focal weakness or numbness, or any other new or concerning symptoms, please return to the ED immediately, as these may be signs that your headache has become a potentially serious and life-threatening condition.  Please return if you have any other emergent  concerns.  Additional Information:   Your vital signs today were: BP 109/72    Pulse (!) 57    Temp 98.4 F (36.9 C) (Oral)    Resp 18    Ht 5\' 7"  (1.702 m)    Wt 97.5 kg    SpO2 99%    BMI 33.67 kg/m  If your blood pressure (BP) was elevated on multiple readings during this visit above 130 for the top number or above 80 for the bottom number, please have this repeated by your primary care provider within one month. --------------  Thank you for allowing [place] to participate in your care today.

## 2018-10-05 NOTE — ED Notes (Signed)
Discharge instructions, prescription, and follow up care discussed with pt. Pt verbalized understanding and no questions at this time.   Signature pad time out, unable to obtain signature

## 2018-10-05 NOTE — ED Provider Notes (Signed)
[facility] EMERGENCY DEPARTMENT Provider Note   CSN: [id number] Arrival date & time: 10/04/18  2124    History   Chief Complaint Chief Complaint  Patient presents with  . Headache    HPI Carolyn Atkinson is a 28 y.o. female.     HPI  Patient is a 28 year old female with past medical history of headaches, prediabetes, presenting for posterior headache and dizziness.  Patient reports that she has had a daily headache for approximately a week to 1.5 weeks.  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  She reports that she is photophobic during the day and makes difficult to perform her job and she will also feel like she is spinning.  She denies any positional changes exacerbating her symptoms.  She denies any neck stiffness or pain.  She denies any tinnitus, hearing loss, or sensation of ear fullness.  Patient denies any recent respiratory infections.  Patient ports she has taken Fioricet for her pain.  Patient ports that her grandmother has a history of intracranial aneurysm.  Patient has no personal history of hypercoagulability.  She does not take estrogen.  Past Medical History:  Diagnosis Date  . [id number](478.2)     Patient Active Problem List   Diagnosis Date Noted  . Migraine without aura and without status migrainosus, not intractable 11/05/2014  . Prediabetes 10/06/2014    Past Surgical History:  Procedure Laterality Date  . LEEP    . TUBAL LIGATION       OB History    Gravida  2   Para  2   Term  1   Preterm  1   AB      Living  2     SAB      TAB      Ectopic      Multiple      Live Births  2            Home Medications    Prior to Admission medications   Medication Sig Start Date End Date Taking? Authorizing Provider  acetaminophen (TYLENOL) 500 MG tablet Take 1 tablet (500 mg total) by mouth every 6 (six) hours as needed. 08/29/18   Georgetta Haber, NP  hydrOXYzine  (ATARAX/VISTARIL) 25 MG tablet Take 0.5-1 tablets (12.5-25 mg total) by mouth every 8 (eight) hours as needed for itching. 03/08/18   Wallis Bamberg, PA-C  meloxicam (MOBIC) 7.5 MG tablet Take 1 tablet (7.5 mg total) by mouth daily. 03/08/18   Wallis Bamberg, PA-C  ondansetron (ZOFRAN ODT) 4 MG disintegrating tablet Take 1 tablet (4 mg total) by mouth every 8 (eight) hours as needed for nausea or vomiting. 06/01/18   Wieters, Hallie C, PA-C  ranitidine (ZANTAC) 150 MG tablet Take 1 tablet (150 mg total) by mouth 2 (two) times daily. 03/08/18   Wallis Bamberg, PA-C  triamcinolone cream (KENALOG) 0.1 % Apply 1 application topically 2 (two) times daily. 08/29/18   Georgetta Haber, NP    Family History Family History  Problem Relation Age of Onset  . Cancer Mother   . Stroke Father   . Hypertension Maternal Grandmother   . Diabetes Maternal Grandmother   . Cancer Maternal Grandmother   . Heart disease Neg Hx     Social History Social History   Tobacco Use  . Smoking status: Never Smoker  . Smokeless tobacco: Never Used  Substance Use Topics  . Alcohol use: No  .  Drug use: Never     Allergies   Toradol [ketorolac tromethamine] and Other   Review of Systems Review of Systems  Constitutional: Negative for chills and fever.  HENT: Negative for congestion, rhinorrhea and sinus pain.   Eyes: Positive for photophobia. Negative for visual disturbance.  Gastrointestinal: Negative for abdominal pain, nausea and vomiting.  Musculoskeletal: Negative for neck pain and neck stiffness.  Neurological: Positive for dizziness and headaches. Negative for weakness and numbness.  All other systems reviewed and are negative.    Physical Exam Updated Vital Signs BP 99/68   Pulse (!) 57   Temp 98.4 F (36.9 C) (Oral)   Resp 18   Ht  (1.702 m)   Wt 97.5 kg   SpO2 99%   BMI 33.67 kg/m   Physical Exam Vitals signs and nursing note reviewed.  Constitutional:      General: She is not in acute  distress.    Appearance: She is well-developed.  HENT:     Head: Normocephalic and atraumatic.     Comments: Patient does have TTP overlying occipital scalp. Eyes:     Extraocular Movements:     Right eye: No nystagmus.     Left eye: No nystagmus.     Conjunctiva/sclera: Conjunctivae normal.     Pupils: Pupils are equal, round, and reactive to light.  Neck:     Musculoskeletal: Normal range of motion and neck supple. No neck rigidity.     Meningeal: Brudzinski's sign absent.  Cardiovascular:     Rate and Rhythm: Normal rate and regular rhythm.     Heart sounds: S1 normal and S2 normal. No murmur.  Pulmonary:     Effort: Pulmonary effort is normal.     Breath sounds: Normal breath sounds. No wheezing or rales.  Abdominal:     General: There is no distension.     Palpations: Abdomen is soft.     Tenderness: There is no abdominal tenderness. There is no guarding.  Musculoskeletal: Normal range of motion.        General: No deformity.  Lymphadenopathy:     Cervical: No cervical adenopathy.  Skin:    General: Skin is warm and dry.     Findings: No erythema or rash.  Neurological:     Mental Status: She is alert.     GCS: GCS eye subscore is 4. GCS verbal subscore is 5. GCS motor subscore is 6.     Comments: Mental Status:  Alert, oriented, thought content appropriate, able to give a coherent history. Speech fluent without evidence of aphasia. Able to follow 2 step commands without difficulty.  Cranial Nerves:  II:  Peripheral visual fields grossly normal, pupils equal, round, reactive to light III,IV, VI: ptosis not present, extra-ocular motions intact bilaterally  V,VII: smile symmetric, facial light touch sensation equal VIII: hearing grossly normal to voice  X: uvula elevates symmetrically  XI: bilateral shoulder shrug symmetric and strong XII: midline tongue extension without fassiculations Motor:  Normal tone. 5/5 in upper and lower extremities bilaterally including  strong and equal grip strength and dorsiflexion/plantar flexion Sensory: Pinprick and light touch normal in all extremities.  Deep Tendon Reflexes: 2+ and symmetric in the biceps and patella. No clonus. Cerebellar: normal finger-to-nose with bilateral upper extremities Gait: normal gait and balance Stance: Romberg negative. No pronator drift and good coordination, strength, and position sense with tapping of bilateral arms (performed in sitting position). CV: distal pulses palpable throughout    Psychiatric:  Behavior: Behavior normal.        Thought Content: Thought content normal.        Judgment: Judgment normal.      ED Treatments / Results  Labs (all labs ordered are listed, but only abnormal results are displayed) Labs Reviewed  COMPREHENSIVE METABOLIC PANEL - Abnormal; Notable for the following components:      Result Value   Glucose, Bld 107 (*)    Calcium 8.7 (*)    Total Protein 6.3 (*)    Albumin 3.3 (*)    All other components within normal limits  CBC WITH DIFFERENTIAL/PLATELET - Abnormal; Notable for the following components:   WBC 11.5 (*)    All other components within normal limits  I-STAT BETA HCG BLOOD, ED ([facility], [facility], [facility] ONLY)    EKG None  Radiology No results found.  Procedures Procedures (including critical care time)  Medications Ordered in ED Medications  sodium chloride 0.9 % bolus 1,000 mL (1,000 mLs Intravenous New Bag/Given 10/04/18 2303)  prochlorperazine (COMPAZINE) injection 10 mg (10 mg Intravenous Given 10/04/18 2305)  diphenhydrAMINE (BENADRYL) injection 12.5 mg (12.5 mg Intravenous Given 10/04/18 2305)  meclizine (ANTIVERT) tablet 25 mg (25 mg Oral Given 10/04/18 2310)     Initial Impression / Assessment and Plan / ED Course  I have reviewed the triage vital signs and the nursing notes.  Pertinent labs & imaging results that were available during my care of the patient were reviewed by me and considered in my medical decision  making (see chart for details).  Clinical Course as of Oct 05 46  Mon Oct 05, 2018  0036 Calcium(!): 8.7 [AM]  0036 Total Protein(!): 6.3 [AM]  0036 Albumin(!): 3.3 [AM]  0039 Reports headache is gone and she feels much better.    [AM]    Clinical Course User Index [AM] Elisha PonderMurray, Frandy Basnett B, PA-C       Patient without high-risk features of headache including: sudden onset/thunderclap HA, no similar headache in past, altered mental status, accompanying seizure, headache with exertion, age > 6350, history of immunocompromise, neck or shoulder pain, fever, use of anticoagulation, family history of spontaneous SAH, concomitant drug use, toxic exposure.   Patient has a normal complete neurological exam, normal vital signs, normal level of consciousness, no signs of meningismus, is well-appearing/non-toxic appearing, no signs of trauma.  There appears to be a tension type association with this headache given that patient has hyperesthesia to palpation of the occipital scalp.  Imaging with CT/MRI not indicated given history and physical exam findings.  Patient has had MRI in 2016 as well as previous CT scans which showed no evidence of Chiari malformation, or other intracranial abnormalities that would be concerning for her condition.  Given the time course of her symptoms and lack of neurologic deficit, I find cervical artery dissection unlikely.  No dangerous or life-threatening conditions suspected or identified by history, physical exam, and by work-up. No indications for hospitalization identified.   Patient has significant symptomatic improvement during emergency department course with migraine cocktail.  She reports that she feels that she can get some sleep and that her headache is gone.  Will have patient follow-up with neurology given many year history of headaches requiring ER treatment.  This is a shared visit with Dr. Cathren LaineKevin Steinl. Patient was independently evaluated by this attending  physician. Attending physician consulted in evaluation and discharge management.  Final Clinical Impressions(s) / ED Diagnoses   Final diagnoses:  Bad headache  Occipital headache  ED Discharge Orders         Ordered    Ambulatory referral to Neurology    Comments:  An appointment is requested in approximately: 2 weeks   10/05/18 0034    meclizine (ANTIVERT) 25 MG tablet  3 times daily PRN     10/05/18 0034           Elisha Ponder, PA-C 10/05/18 0049    Cathren Laine, MD 10/05/18 [phone]

## 2018-10-13 ENCOUNTER — Encounter: Payer: Self-pay | Admitting: *Deleted

## 2018-10-13 ENCOUNTER — Other Ambulatory Visit: Payer: Self-pay

## 2018-10-13 ENCOUNTER — Telehealth: Payer: Self-pay | Admitting: *Deleted

## 2018-10-13 ENCOUNTER — Ambulatory Visit: Payer: Self-pay | Admitting: Neurology

## 2018-10-13 NOTE — Progress Notes (Signed)
Rescheduled

## 2018-10-13 NOTE — Telephone Encounter (Signed)
She had a new patient video visit on 10/13/2018 at 2:30pm.  She was connected but was in a car with other people.  Dr. Terrace Arabia offered to call her so she could hear better.  She did not answer the phone.  I tried her again and she answered.  I told her Dr. Terrace Arabia would be calling her within the minute from a blocked number.  Dr. Terrace Arabia said was not able to get an answer again.  I tried two more time and could not get her either.  Left message requesting her to call back to reschedule her appt.  We need to pick a time when she is available to be in a quiet environment so she will have a quality visit.

## 2018-10-14 ENCOUNTER — Encounter: Payer: Self-pay | Admitting: Neurology

## 2018-10-14 ENCOUNTER — Ambulatory Visit (INDEPENDENT_AMBULATORY_CARE_PROVIDER_SITE_OTHER): Payer: 59 | Admitting: Neurology

## 2018-10-14 ENCOUNTER — Other Ambulatory Visit: Payer: Self-pay

## 2018-10-14 DIAGNOSIS — R51 Headache: Secondary | ICD-10-CM | POA: Diagnosis not present

## 2018-10-14 DIAGNOSIS — G43709 Chronic migraine without aura, not intractable, without status migrainosus: Secondary | ICD-10-CM | POA: Insufficient documentation

## 2018-10-14 DIAGNOSIS — IMO0002 Reserved for concepts with insufficient information to code with codable children: Secondary | ICD-10-CM | POA: Insufficient documentation

## 2018-10-14 DIAGNOSIS — R519 Headache, unspecified: Secondary | ICD-10-CM | POA: Insufficient documentation

## 2018-10-14 MED ORDER — RIZATRIPTAN BENZOATE 10 MG PO TBDP: 10.0000 mg | ORAL_TABLET | ORAL | 6 refills | Status: DC | PRN

## 2018-10-14 MED ORDER — VENLAFAXINE HCL ER 37.5 MG PO CP24: 37.5000 mg | ORAL_CAPSULE | Freq: Every day | ORAL | 11 refills | Status: DC

## 2018-10-14 NOTE — Addendum Note (Signed)
Addended by: Levert Feinstein on: 10/14/2018 03:30 PM   Modules accepted: Level of Service

## 2018-10-14 NOTE — Progress Notes (Signed)
PATIENT: Carolyn Atkinson DOB: 01/12/1991  Virtual Visit via video  I connected with [name] on 10/14/18 at  by video and verified that I am speaking with the correct person using two identifiers.   I discussed the limitations, risks, security and privacy concerns of performing an evaluation and management service by video and the availability of in person appointments. I also discussed with the patient that there may be a patient responsible charge related to this service. The patient expressed understanding and agreed to proceed.  HISTORICAL  Carolyn Atkinson is a 28 year old female, seen in request by PA Aviva Kluver for evaluation of frequent headaches, dizziness  I have reviewed and summarized the referring note from the referring physician.  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  She presented to emergency room on April 27 for similar complaints,  Laboratory evaluations in April 2020, normal CBC with WBC of 11.5, CMP showed total protein of 6.3, albumin of 3.3  She previously tried over-the-counter medication Tylenol, ibuprofen without helping her symptoms   Observations/Objective: I have  reviewed problem lists, medications, allergies.  Awake alert oriented to history taking care of conversation  Assessment and Plan: Chronic migraine headaches  Start Effexor Xr 37.5 mg every day as preventive medications  Maxalt 10 mg as needed  Will help her feel FMLA paperwork, maximum 1 day each week for absent  Follow Up Instructions:  In 2 months    I discussed the assessment and treatment plan with the patient. The patient was provided an opportunity to ask questions and all were answered. The patient agreed with the plan and demonstrated an understanding of the instructions.   The patient was advised to call back or seek an in-person evaluation if the symptoms worsen or if the condition fails to improve as anticipated.  I provided 30 minutes of non-face-to-face time during this encounter.  REVIEW OF SYSTEMS: Full 14 system review of systems performed and notable only for as above All other review of systems were negative.  ALLERGIES: Allergies  Allergen Reactions  . Acetaminophen Hives  . Toradol [Ketorolac Tromethamine]   . Other Hives and Rash    Injection-Name is unknown - when pregnant    HOME MEDICATIONS: Current Outpatient Medications  Medication Sig Dispense Refill  . hydrOXYzine (ATARAX/VISTARIL) 25 MG tablet Take 0.5-1 tablets (12.5-25 mg total) by mouth every 8 (eight) hours as needed for itching. 60 tablet 0  . meclizine (ANTIVERT) 25 MG tablet Take 1 tablet (25 mg total) by mouth 3 (three) times daily as needed for dizziness. 21 tablet 0  . ondansetron (ZOFRAN ODT) 4 MG disintegrating tablet Take 1 tablet (4 mg total) by mouth every 8 (eight) hours as needed for nausea or vomiting. 20 tablet 0  . triamcinolone  cream (KENALOG) 0.1 % Apply 1 application topically 2 (two) times daily. 30 g 0   No current facility-administered medications for this visit.     PAST MEDICAL HISTORY: Past Medical History:  Diagnosis Date  . Dizziness   . Eczema   . GERD  (gastroesophageal reflux disease)   . Headache(784.0)     PAST SURGICAL HISTORY: Past Surgical History:  Procedure Laterality Date  . LEEP    . TUBAL LIGATION      FAMILY HISTORY: Family History  Problem Relation Age of Onset  . Breast cancer Mother   . Stroke Father   . Hypertension Maternal Grandmother   . Diabetes Maternal Grandmother   . Cancer Maternal Grandmother        unsure of type  . Headache Maternal Grandmother   . Aneurysm Maternal Grandmother   . Thyroid cancer Maternal Grandfather   . Heart disease Neg Hx     SOCIAL HISTORY:   Social History   Socioeconomic History  . Marital status: Single    Spouse name: Not on file  . Number of children: 2  . Years of education: come college  . Highest education level: Not on file  Occupational History  . Occupation: mail carrier  Social Needs  . Financial resource strain: Not on file  . Food insecurity:    Worry: Not on file    Inability: Not on file  . Transportation needs:    Medical: Not on file    Non-medical: Not on file  Tobacco Use  . Smoking status: Never Smoker  . Smokeless tobacco: Never Used  Substance and Sexual Activity  . Alcohol use: No  . Drug use: Never  . Sexual activity: Not on file  Lifestyle  . Physical activity:    Days per week: Not on file    Minutes per session: Not on file  . Stress: Not on file  Relationships  . Social connections:    Talks on phone: Not on file    Gets together: Not on file    Attends religious service: Not on file    Active member of club or organization: Not on file    Attends meetings of clubs or organizations: Not on file    Relationship status: Not on file  . Intimate partner violence:    Fear of current or ex partner: Not on file    Emotionally abused: Not on file    Physically abused: Not on file    Forced sexual activity: Not on file  Other Topics Concern  . Not on file  Social History Narrative   Lives at home with her children and  significant other.   Right-handed.   Caffeine use:  1-2 Mountain Dew drinks per day    Levert FeinsteinYijun Karrington Mccravy, M.D. Ph.D.  [facility] [address], [address] [place], [place] [address] Ph: [phone] Fax: [phone]  CC:

## 2018-10-21 ENCOUNTER — Encounter: Payer: Self-pay | Admitting: Neurology

## 2018-11-05 ENCOUNTER — Telehealth: Payer: Self-pay | Admitting: *Deleted

## 2018-11-05 NOTE — Telephone Encounter (Signed)
R/c fmla form waiting on payment.  

## 2018-11-17 DIAGNOSIS — Z0289 Encounter for other administrative examinations: Secondary | ICD-10-CM

## 2019-01-28 ENCOUNTER — Emergency Department (HOSPITAL_COMMUNITY)
Admission: EM | Admit: 2019-01-28 | Discharge: 2019-01-29 | Disposition: A | Discharge status: Home / Self Care | Payer: 59 | Attending: Emergency Medicine | Admitting: Emergency Medicine

## 2019-01-28 ENCOUNTER — Other Ambulatory Visit: Payer: Self-pay

## 2019-01-28 ENCOUNTER — Encounter (HOSPITAL_COMMUNITY): Payer: Self-pay

## 2019-01-28 DIAGNOSIS — R51 Headache: Secondary | ICD-10-CM | POA: Insufficient documentation

## 2019-01-28 DIAGNOSIS — R4182 Altered mental status, unspecified: Secondary | ICD-10-CM | POA: Diagnosis not present

## 2019-01-28 DIAGNOSIS — R569 Unspecified convulsions: Secondary | ICD-10-CM | POA: Diagnosis present

## 2019-01-28 DIAGNOSIS — R4586 Emotional lability: Secondary | ICD-10-CM

## 2019-01-28 DIAGNOSIS — R064 Hyperventilation: Secondary | ICD-10-CM

## 2019-01-28 NOTE — ED Triage Notes (Signed)
Pt arrives via [facility] after c/o seizure like activity. Per EMS pt was c/o headache, however when EMS arrived pt began hyperventilating, and then became "rigid" for approximately 20 seconds, and then went unresponsive. Pt remains unresponsive, VSS, pupils round, equal and reactive to light.

## 2019-01-29 ENCOUNTER — Emergency Department (HOSPITAL_COMMUNITY): Payer: 59

## 2019-01-29 LAB — CBC WITH DIFFERENTIAL/PLATELET
Abs Immature Granulocytes: 0.04 10*3/uL (ref 0.00–0.07)
Basophils Absolute: 0.1 10*3/uL (ref 0.0–0.1)
Basophils Relative: 1 %
Eosinophils Absolute: 0.2 10*3/uL (ref 0.0–0.5)
Eosinophils Relative: 2 %
HCT: 39.1 % (ref 36.0–46.0)
Hemoglobin: 12.8 g/dL (ref 12.0–15.0)
Immature Granulocytes: 0 %
Lymphocytes Relative: 19 %
Lymphs Abs: 2.4 10*3/uL (ref 0.7–4.0)
MCH: 28.8 pg (ref 26.0–34.0)
MCHC: 32.7 g/dL (ref 30.0–36.0)
MCV: 87.9 fL (ref 80.0–100.0)
Monocytes Absolute: 0.9 10*3/uL (ref 0.1–1.0)
Monocytes Relative: 7 %
Neutro Abs: 9.3 10*3/uL — ABNORMAL HIGH (ref 1.7–7.7)
Neutrophils Relative %: 71 %
Platelets: 361 10*3/uL (ref 150–400)
RBC: 4.45 MIL/uL (ref 3.87–5.11)
RDW: 12.7 % (ref 11.5–15.5)
WBC: 12.9 10*3/uL — ABNORMAL HIGH (ref 4.0–10.5)
nRBC: 0 % (ref 0.0–0.2)

## 2019-01-29 LAB — COMPREHENSIVE METABOLIC PANEL
ALT: 20 U/L (ref 0–44)
AST: 39 U/L (ref 15–41)
Albumin: 3.6 g/dL (ref 3.5–5.0)
Alkaline Phosphatase: 90 U/L (ref 38–126)
Anion gap: 9 (ref 5–15)
BUN: 10 mg/dL (ref 6–20)
CO2: 24 mmol/L (ref 22–32)
Calcium: 8.8 mg/dL — ABNORMAL LOW (ref 8.9–10.3)
Chloride: 104 mmol/L (ref 98–111)
Creatinine, Ser: 0.93 mg/dL (ref 0.44–1.00)
GFR calc Af Amer: >60 mL/min (ref >60)
GFR calc non Af Amer: >60 mL/min (ref >60)
Glucose, Bld: 150 mg/dL — ABNORMAL HIGH (ref 70–99)
Potassium: 5.5 mmol/L — ABNORMAL HIGH (ref 3.5–5.1)
Sodium: 137 mmol/L (ref 135–145)
Total Bilirubin: 1.4 mg/dL — ABNORMAL HIGH (ref 0.3–1.2)
Total Protein: 6.5 g/dL (ref 6.5–8.1)

## 2019-01-29 LAB — SALICYLATE LEVEL: Salicylate Lvl: <7 mg/dL (ref 2.8–30.0)

## 2019-01-29 LAB — RAPID URINE DRUG SCREEN, HOSP PERFORMED
Amphetamines: NOT DETECTED
Barbiturates: NOT DETECTED
Benzodiazepines: NOT DETECTED
Cocaine: NOT DETECTED
Opiates: NOT DETECTED
Tetrahydrocannabinol: POSITIVE — AB

## 2019-01-29 LAB — ETHANOL: Alcohol, Ethyl (B): <10 mg/dL (ref <10)

## 2019-01-29 LAB — I-STAT BETA HCG BLOOD, ED (MC, WL, AP ONLY): I-stat hCG, quantitative: <5 m[IU]/mL (ref <5)

## 2019-01-29 LAB — ACETAMINOPHEN LEVEL: Acetaminophen (Tylenol), Serum: <10 ug/mL — ABNORMAL LOW (ref 10–30)

## 2019-01-29 MED ORDER — NALOXONE HCL 2 MG/2ML IJ SOSY
1.0000 mg | PREFILLED_SYRINGE | Freq: Once | INTRAMUSCULAR | Status: AC
  Administered 2019-01-29: 1 mg via INTRAVENOUS
  Filled 2019-01-29: qty 2

## 2019-01-29 MED ORDER — SODIUM CHLORIDE 0.9 % IV BOLUS
1000.0000 mL | Freq: Once | INTRAVENOUS | Status: AC
  Administered 2019-01-29: 1000 mL via INTRAVENOUS

## 2019-01-29 NOTE — ED Notes (Signed)
Paged security for wanding

## 2019-01-29 NOTE — ED Notes (Signed)
Patient refused a snack and drink. 

## 2019-01-29 NOTE — ED Provider Notes (Signed)
[facility] EMERGENCY [facility]MENT Provider Note   CSN: [id number] Arrival date & time: 01/28/19  2308     History   Chief Complaint Chief Complaint  Patient presents with  . Seizures    HPI Carolyn Atkinson is a 28 y.o. female.     Per EMS, they were called by SO/family member for evaluation of headache. EMS reports that while being evaluated the patient started to hyperventilate, became rigid and lost consciousness. The patient wakes to sternal rub in ED, answers 'yes' and 'no'. Denies pain. Denies memory of what happened.   The history is provided by the EMS personnel and the patient. No language interpreter was used.  Seizures   Past Medical History:  Diagnosis Date  . Dizziness   . Eczema   . GERD (gastroesophageal reflux disease)   . OZHYQMVH(846.9Headache(784.0)     Patient Active Problem List   Diagnosis Date Noted  . Chronic migraine 10/14/2018  . Chronic daily headache 10/14/2018  . Migraine without aura and without status migrainosus, not intractable 11/05/2014  . Prediabetes 10/06/2014    Past Surgical History:  Procedure Laterality Date  . LEEP    . TUBAL LIGATION       OB History    Gravida  2   Para  2   Term  1   Preterm  1   AB      Living  2     SAB      TAB      Ectopic      Multiple      Live Births  2            Home Medications    Prior to Admission medications   Medication Sig Start Date End Date Taking? Authorizing Provider  hydrOXYzine (ATARAX/VISTARIL) 25 MG tablet Take 0.5-1 tablets (12.5-25 mg total) by mouth every 8 (eight) hours as needed for itching. 03/08/18   Wallis BambergMani, Mario, PA-C  meclizine (ANTIVERT) 25 MG tablet Take 1 tablet (25 mg total) by mouth 3 (three) times daily as needed for dizziness. 10/05/18   Aviva KluverMurray, Alyssa B, PA-C  ondansetron (ZOFRAN ODT) 4 MG disintegrating tablet Take 1 tablet (4 mg total) by mouth every 8 (eight) hours as needed for nausea or vomiting. 06/01/18   Wieters, Hallie C,  PA-C  rizatriptan (MAXALT-MLT) 10 MG disintegrating tablet Take 1 tablet (10 mg total) by mouth as needed. May repeat in 2 hours if needed 10/14/18   Levert FeinsteinYan, Yijun, MD  triamcinolone cream (KENALOG) 0.1 % Apply 1 application topically 2 (two) times daily. 08/29/18   Georgetta HaberBurky, Natalie B, NP  venlafaxine XR (EFFEXOR XR) 37.5 MG 24 hr capsule Take 1 capsule (37.5 mg total) by mouth daily with breakfast. 10/14/18   Levert FeinsteinYan, Yijun, MD    Family History Family History  Problem Relation Age of Onset  . Breast cancer Mother   . Stroke Father   . Hypertension Maternal Grandmother   . Diabetes Maternal Grandmother   . Cancer Maternal Grandmother        unsure of type  . Headache Maternal Grandmother   . Aneurysm Maternal Grandmother   . Thyroid cancer Maternal Grandfather   . Heart disease Neg Hx     Social History Social History   Tobacco Use  . Smoking status: Never Smoker  . Smokeless tobacco: Never Used  Substance Use Topics  . Alcohol use: No  . Drug use: Never     Allergies   Acetaminophen, Toradol [  ketorolac tromethamine], and Other   Review of Systems Review of Systems  Unable to perform ROS: Mental status change  Neurological: Positive for seizures.     Physical Exam Updated Vital Signs BP 101/65   Pulse 96   Temp 98.9 F (37.2 C) (Rectal)   Resp (!) 21   SpO2 98%   Physical Exam Vitals signs and nursing note reviewed.  Constitutional:      Appearance: She is well-developed.  HENT:     Head: Normocephalic and atraumatic.     Mouth/Throat:     Mouth: Mucous membranes are moist.  Eyes:     Pupils: Pupils are equal, round, and reactive to light.  Neck:     Musculoskeletal: Normal range of motion and neck supple.  Cardiovascular:     Rate and Rhythm: Normal rate and regular rhythm.     Heart sounds: No murmur.  Pulmonary:     Effort: Pulmonary effort is normal.     Breath sounds: Normal breath sounds. No wheezing, rhonchi or rales.     Comments: Breathing  non-labored. Abdominal:     General: Bowel sounds are normal.     Palpations: Abdomen is soft.     Tenderness: There is no abdominal tenderness. There is no guarding or rebound.  Musculoskeletal: Normal range of motion.  Skin:    General: Skin is warm and dry.  Neurological:     Mental Status: She is alert.     Comments: Wakes to sternal rub. Follows commands.      ED Treatments / Results  Labs (all labs ordered are listed, but only abnormal results are displayed) Labs Reviewed  CBC WITH DIFFERENTIAL/PLATELET - Abnormal; Notable for the following components:      Result Value   WBC 12.9 (*)    Neutro Abs 9.3 (*)    All other components within normal limits  COMPREHENSIVE METABOLIC PANEL - Abnormal; Notable for the following components:   Potassium 5.5 (*)    Glucose, Bld 150 (*)    Calcium 8.8 (*)    Total Bilirubin 1.4 (*)    All other components within normal limits  ACETAMINOPHEN LEVEL - Abnormal; Notable for the following components:   Acetaminophen (Tylenol), Serum <10 (*)    All other components within normal limits  SALICYLATE LEVEL  ETHANOL  RAPID URINE DRUG SCREEN, HOSP PERFORMED  I-STAT BETA HCG BLOOD, ED ([facility], [facility], [facility] ONLY)   Results for orders placed or performed during the hospital encounter of 01/28/19  CBC with Differential  Result Value Ref Range   WBC 12.9 (H) 4.0 - 10.5 K/uL   RBC 4.45 3.87 - 5.11 MIL/uL   Hemoglobin 12.8 12.0 - 15.0 g/dL   HCT 16.139.1 09.636.0 - 04.546.0 %   MCV 87.9 80.0 - 100.0 fL   MCH 28.8 26.0 - 34.0 pg   MCHC 32.7 30.0 - 36.0 g/dL   RDW 40.912.7 81.111.5 - 91.415.5 %   Platelets 361 150 - 400 K/uL   nRBC 0.0 0.0 - 0.2 %   Neutrophils Relative % 71 %   Neutro Abs 9.3 (H) 1.7 - 7.7 K/uL   Lymphocytes Relative 19 %   Lymphs Abs 2.4 0.7 - 4.0 K/uL   Monocytes Relative 7 %   Monocytes Absolute 0.9 0.1 - 1.0 K/uL   Eosinophils Relative 2 %   Eosinophils Absolute 0.2 0.0 - 0.5 K/uL   Basophils Relative 1 %   Basophils Absolute 0.1 0.0 - 0.1 K/uL    Immature  Granulocytes 0 %   Abs Immature Granulocytes 0.04 0.00 - 0.07 K/uL  Salicylate level  Result Value Ref Range   Salicylate Lvl <7.0 2.8 - 30.0 mg/dL  Comprehensive metabolic panel  Result Value Ref Range   Sodium 137 135 - 145 mmol/L   Potassium 5.5 (H) 3.5 - 5.1 mmol/L   Chloride 104 98 - 111 mmol/L   CO2 24 22 - 32 mmol/L   Glucose, Bld 150 (H) 70 - 99 mg/dL   BUN 10 6 - 20 mg/dL   Creatinine, Ser 1.610.93 0.44 - 1.00 mg/dL   Calcium 8.8 (L) 8.9 - 10.3 mg/dL   Total Protein 6.5 6.5 - 8.1 g/dL   Albumin 3.6 3.5 - 5.0 g/dL   AST 39 15 - 41 U/L   ALT 20 0 - 44 U/L   Alkaline Phosphatase 90 38 - 126 U/L   Total Bilirubin 1.4 (H) 0.3 - 1.2 mg/dL   GFR calc non Af Amer >60 >60 mL/min   GFR calc Af Amer >60 >60 mL/min   Anion gap 9 5 - 15  Acetaminophen level  Result Value Ref Range   Acetaminophen (Tylenol), Serum <10 (L) 10 - 30 ug/mL  Ethanol  Result Value Ref Range   Alcohol, Ethyl (B) <10 <10 mg/dL  Urine rapid drug screen (hosp performed)  Result Value Ref Range   Opiates NONE DETECTED NONE DETECTED   Cocaine NONE DETECTED NONE DETECTED   Benzodiazepines NONE DETECTED NONE DETECTED   Amphetamines NONE DETECTED NONE DETECTED   Tetrahydrocannabinol POSITIVE (A) NONE DETECTED   Barbiturates NONE DETECTED NONE DETECTED  I-Stat beta hCG blood, ED  Result Value Ref Range   I-stat hCG, quantitative <5.0 <5 mIU/mL   Comment 3            EKG None  Radiology Ct Head Wo Contrast  Result Date: 01/29/2019 CLINICAL DATA:  Altered level of consciousness (LOC), unexplained EXAM: CT HEAD WITHOUT CONTRAST TECHNIQUE: Contiguous axial images were obtained from the base of the skull through the vertex without intravenous contrast. COMPARISON:  Brain MRI 05/30/2015 FINDINGS: Brain: No intracranial hemorrhage, mass effect, or midline shift. No hydrocephalus. The basilar cisterns are patent. No evidence of territorial infarct or acute ischemia. No extra-axial or intracranial fluid  collection. Vascular: No hyperdense vessel. Skull: No fracture or focal lesion. Sinuses/Orbits: Paranasal sinuses and mastoid air cells are clear. The visualized orbits are unremarkable. Other: None. IMPRESSION: Unremarkable noncontrast head CT. Electronically Signed   By: Narda RutherfordMelanie  Sanford M.D.   On: 01/29/2019 00:59    Procedures Procedures (including critical care time)  Medications Ordered in ED Medications - No data to display   Initial Impression / Assessment and Plan / ED Course  I have reviewed the triage vital signs and the nursing notes.  Pertinent labs & imaging results that were available during my care of the patient were reviewed by me and considered in my medical decision making (see chart for details).        Patient to ED for evaluation of altered mental status. Initially, EMS was called for headache. Patient reportedly started hyperventilating, became rigid and passed out. On arrival in the ED, she wakes to painful stimuli. VSS.   She is observed over a period of hours with little change in her mental status. Labs are essentially unremarkable. Head CT negative for acute findings. IVF's given for low blow pressure with improvement. Mom remains at bedside.   4:15 - Patient given Narcan 1 mg without change  in condition.   5:30 - Patient woken up. During attempt to ambulate, she pulls herself up to a sitting position. She is tearful but denies pain. She ultimately stands and is fully weight bearing. She denies that she was injured or assaulted; denies SI/HI. When asked how I can help her she states "I don't know".   6:15 - she seems to wake easier but is still minimally interactive. Doubt neurologic event given no deficit to exam, normal head CT. Labs do not support infection or acute process. Feel she would benefit from TTS evaluation to establish whether there is an underlying psychiatric issue.  Patient care signed out to oncoming provider team at end of shift.   Final  Clinical Impressions(s) / ED Diagnoses   Final diagnoses:  None   1. Altered mental status.   ED Discharge Orders    None       Charlann Lange, Hershal Coria 01/29/19 [place], April, MD 01/29/19 [id number]

## 2019-01-29 NOTE — BHH Counselor (Signed)
  FAMILY COLLATERAL  [place] attempted to obtain additional information from pt's mother, Kiamesha Samet [phone]); but, mother would not answer her phone and [facility] could not leave a voicemail.  TTS will try to complete a Family Collateral at a later time.  Blia Totman L. [place], [place], [facility], [facility] Therapeutic Triage Specialist  [phone]

## 2019-01-29 NOTE — ED Provider Notes (Signed)
Patient reassessed and indicates is feeling improved. No current headache. Patient has normal mood/affect. Denies feelings of either severe anxiety or depression. Denies any thoughts of harm to self or others. Denies acute/new stressors. No delusions or hallucinations.   Patient indicates she feels ready to go home.  Pt currently appears stable for d/c.   Rec pcp f/u and will also provide resources for outpatient behavioral health f/u.   Return precautions discussed w pt, and provided.      Lajean Saver, MD 01/29/19 [phone]

## 2019-01-29 NOTE — ED Notes (Signed)
Pt's Mother does not want to be updated unless Pt can call her, herself and do the update.

## 2019-01-29 NOTE — ED Notes (Signed)
Pt sitting upright on bed, does not acknowledge RN's offer to lie down

## 2019-01-29 NOTE — Progress Notes (Signed)
   01/29/19 0856  General Assessment Data  Reason for not completing assessment [facility] attempted to complete assessment with Dr. Dwyane Dee.  Pt would not respond to either clinician nor provider.  TTS will assess pt at a later time.      Vennela Jutte L. [place], [place], [facility], [facility] Therapeutic Triage Specialist  [phone]

## 2019-01-29 NOTE — Discharge Instructions (Addendum)
It was our pleasure to provide your ER care today - we hope that you feel better.  Follow up with primary care doctor in the coming week - call office to arrange appointment.  Also see resource guide provided, as relates outpatient behavioral health and counseling resources.   For mental health issues and/or crisis, you may go directly to [facility] or to [facility]   Return to ER if worse, new symptoms, fevers, new or severe pain, trouble breathing, or other concern.

## 2019-04-12 ENCOUNTER — Ambulatory Visit (INDEPENDENT_AMBULATORY_CARE_PROVIDER_SITE_OTHER): Payer: 59

## 2019-04-12 ENCOUNTER — Encounter (HOSPITAL_COMMUNITY): Payer: Self-pay | Admitting: Emergency Medicine

## 2019-04-12 ENCOUNTER — Ambulatory Visit (HOSPITAL_COMMUNITY)
Admission: EM | Admit: 2019-04-12 | Discharge: 2019-04-12 | Disposition: A | Discharge status: Home / Self Care | Payer: 59 | Attending: Emergency Medicine | Admitting: Emergency Medicine

## 2019-04-12 ENCOUNTER — Other Ambulatory Visit: Payer: Self-pay

## 2019-04-12 DIAGNOSIS — J3489 Other specified disorders of nose and nasal sinuses: Secondary | ICD-10-CM

## 2019-04-12 DIAGNOSIS — M791 Myalgia, unspecified site: Secondary | ICD-10-CM

## 2019-04-12 DIAGNOSIS — R0602 Shortness of breath: Secondary | ICD-10-CM

## 2019-04-12 DIAGNOSIS — Z20828 Contact with and (suspected) exposure to other viral communicable diseases: Secondary | ICD-10-CM | POA: Diagnosis not present

## 2019-04-12 DIAGNOSIS — R6883 Chills (without fever): Secondary | ICD-10-CM

## 2019-04-12 DIAGNOSIS — R062 Wheezing: Secondary | ICD-10-CM

## 2019-04-12 DIAGNOSIS — Z20822 Contact with and (suspected) exposure to covid-19: Secondary | ICD-10-CM

## 2019-04-12 DIAGNOSIS — R197 Diarrhea, unspecified: Secondary | ICD-10-CM

## 2019-04-12 DIAGNOSIS — R05 Cough: Secondary | ICD-10-CM

## 2019-04-12 DIAGNOSIS — R519 Headache, unspecified: Secondary | ICD-10-CM

## 2019-04-12 MED ORDER — BENZONATATE 200 MG PO CAPS: 200.0000 mg | ORAL_CAPSULE | Freq: Three times a day (TID) | ORAL | 0 refills | Status: DC | PRN

## 2019-04-12 MED ORDER — IBUPROFEN 600 MG PO TABS: 600.0000 mg | ORAL_TABLET | Freq: Four times a day (QID) | ORAL | 0 refills | Status: DC | PRN

## 2019-04-12 MED ORDER — AEROCHAMBER PLUS MISC: 2 refills | Status: AC

## 2019-04-12 MED ORDER — HYDROCOD POLST-CPM POLST ER 10-8 MG/5ML PO SUER: 5.0000 mL | Freq: Two times a day (BID) | ORAL | 0 refills | Status: AC | PRN

## 2019-04-12 MED ORDER — FLUTICASONE PROPIONATE 50 MCG/ACT NA SUSP: 2.0000 | Freq: Every day | NASAL | 0 refills | Status: AC

## 2019-04-12 MED ORDER — ALBUTEROL SULFATE HFA 108 (90 BASE) MCG/ACT IN AERS: 1.0000 | INHALATION_SPRAY | Freq: Four times a day (QID) | RESPIRATORY_TRACT | 0 refills | Status: DC | PRN

## 2019-04-12 NOTE — ED Triage Notes (Signed)
Pt states she works at the [employer] and has had several covid cases, unknown if she came in contact with them. Pt c/o chills, cough, body aches, diarrhea since Saturday night.

## 2019-04-12 NOTE — ED Provider Notes (Signed)
HPI  SUBJECTIVE:  Carolyn Atkinson is a 28 y.o. female who presents with 2 days of headaches, body aches, feeling feverish with chills.  Has not measured her temperature at home.  She reports 3 episodes of watery, nonbloody diarrhea per day, rhinorrhea, loss of sense of taste.  Reports left ear pain.  She reports nonproductive cough, wheezing, shortness of breath, dyspnea on exertion.  Denies chest pain.  States the wheezing and shortness of breath started several days before all these other symptoms started.  No nausea, vomiting, sore throat, loss of sense of smell.  Some mild low midline abdominal pain described as cramping, patient attributing this to oncoming menses.  She works for the [employer] office where there have been several cases of Covid.  She has been taking over-the-counter cold medication, NyQuil with temporary relief in her fevers.  Symptoms are worse with being active and lying down.  She has a past medical history of pneumonia, prediabetes, GERD, allergies.  No history of asthma.  She reports allergies to Tylenol and Toradol states that she is okay with ibuprofen.  No history of hypertension.  LMP: 10/12.  Denies the possibility of being pregnant.  PMD: None.   Past Medical History:  Diagnosis Date  . Dizziness   . Eczema   . GERD (gastroesophageal reflux disease)   . NWGNFAOZ(308.6Headache(784.0)     Past Surgical History:  Procedure Laterality Date  . LEEP    . TUBAL LIGATION      Family History  Problem Relation Age of Onset  . Breast cancer Mother   . Stroke Father   . Hypertension Maternal Grandmother   . Diabetes Maternal Grandmother   . Cancer Maternal Grandmother        unsure of type  . Headache Maternal Grandmother   . Aneurysm Maternal Grandmother   . Thyroid cancer Maternal Grandfather   . Heart disease Neg Hx     Social History   Tobacco Use  . Smoking status: Never Smoker  . Smokeless tobacco: Never Used  Substance Use Topics  . Alcohol use: No  . Drug use:  Never    No current facility-administered medications for this encounter.   Current Outpatient Medications:  .  albuterol (VENTOLIN HFA) 108 (90 Base) MCG/ACT inhaler, Inhale 1-2 puffs into the lungs every 6 (six) hours as needed for wheezing or shortness of breath., Disp: 18 g, Rfl: 0 .  benzonatate (TESSALON) 200 MG capsule, Take 1 capsule (200 mg total) by mouth 3 (three) times daily as needed for cough., Disp: 30 capsule, Rfl: 0 .  chlorpheniramine-HYDROcodone (TUSSIONEX PENNKINETIC ER) 10-8 MG/5ML SUER, Take 5 mLs by mouth every 12 (twelve) hours as needed for cough., Disp: 60 mL, Rfl: 0 .  fluticasone (FLONASE) 50 MCG/ACT nasal spray, Place 2 sprays into both nostrils daily., Disp: 16 g, Rfl: 0 .  ibuprofen (ADVIL) 600 MG tablet, Take 1 tablet (600 mg total) by mouth every 6 (six) hours as needed., Disp: 30 tablet, Rfl: 0 .  rizatriptan (MAXALT-MLT) 10 MG disintegrating tablet, Take 1 tablet (10 mg total) by mouth as needed. May repeat in 2 hours if needed (Patient not taking: Reported on 01/29/2019), Disp: 15 tablet, Rfl: 6 .  Spacer/Aero-Holding Chambers (AEROCHAMBER PLUS) inhaler, Use as instructed, Disp: 1 each, Rfl: 2 .  triamcinolone cream (KENALOG) 0.1 %, Apply 1 application topically 2 (two) times daily. (Patient not taking: Reported on 01/29/2019), Disp: 30 g, Rfl: 0 .  venlafaxine XR (EFFEXOR XR) 37.5 MG 24  hr capsule, Take 1 capsule (37.5 mg total) by mouth daily with breakfast. (Patient not taking: Reported on 01/29/2019), Disp: 30 capsule, Rfl: 11  Allergies  Allergen Reactions  . Acetaminophen Hives  . Toradol [Ketorolac Tromethamine]   . Other Hives and Rash    Injection-Name is unknown - when pregnant     ROS  As noted in HPI.   Physical Exam  BP 124/71   Pulse 83   Temp 97.8 F (36.6 C)   Resp 18   LMP 03/12/2019   SpO2 99%   Constitutional: Well developed, well nourished, no acute distress Eyes:  EOMI, conjunctiva normal bilaterally HENT: Normocephalic,  atraumatic,mucus membranes. moist positive nasal congestion.  No sinus tenderness.  TMs normal bilaterally. Respiratory: Normal inspiratory effort, faint wheezing bilaterally.  No rales, rhonchi, prolonged expiratory phase Cardiovascular: Normal rate no murmurs rubs or gallops GI: nondistended skin: No rash, skin intact Musculoskeletal: no deformities Neurologic: Alert & oriented x 3, no focal neuro deficits Psychiatric: Speech and behavior appropriate   ED Course   Medications - No data to display  Orders Placed This Encounter  Procedures  . Novel Coronavirus, NAA (Hosp order, Send-out to Ref Lab; TAT 18-24 hrs    Standing Status:   Standing    Number of Occurrences:   1    Order Specific Question:   Is this test for diagnosis or screening    Answer:   Screening    Order Specific Question:   Symptomatic for COVID-19 as defined by CDC    Answer:   No    Order Specific Question:   Hospitalized for COVID-19    Answer:   No    Order Specific Question:   Admitted to ICU for COVID-19    Answer:   No    Order Specific Question:   Previously tested for COVID-19    Answer:   No    Order Specific Question:   Resident in a congregate (group) care setting    Answer:   No    Order Specific Question:   Employed in healthcare setting    Answer:   No    Order Specific Question:   Pregnant    Answer:   No  . DG Chest 2 View    Standing Status:   Standing    Number of Occurrences:   1    Order Specific Question:   Reason for Exam (SYMPTOM  OR DIAGNOSIS REQUIRED)    Answer:   Cough, wheezing, rule out pneumonia.    No results found for this or any previous visit (from the past 24 hour(s)). Dg Chest 2 View  Result Date: 04/12/2019 CLINICAL DATA:  Cough, wheezing, pneumonia EXAM: CHEST - 2 VIEW COMPARISON:  03/08/2018 FINDINGS: The heart size and mediastinal contours are within normal limits. Both lungs are clear. The visualized skeletal structures are unremarkable. IMPRESSION: No active  cardiopulmonary disease. Electronically Signed   By: Elige Ko   On: 04/12/2019 12:51    ED Clinical Impression  1. Suspected COVID-19 virus infection      ED Assessment/Plan  [place] Narcotic database reviewed for this patient, and feel that the risk/benefit ratio today is favorable for proceeding with a prescription for controlled substance.  No opiate prescriptions since 2019.  Covid test sent.  Checking chest x-ray as patient is very concerned about pneumonia.  Will treat as a suspected Covid infection with ibuprofen 600 mg 3 or 4 times a day, regularly scheduled albuterol with a spacer  for the next several days, Tessalon for the cough during the day, Tussionex for the cough at night.  Discontinue other cold medications, Claritin, start Mucinex D.  Flonase, saline nasal irrigation.  Providing primary care list for ongoing care.  To the ER if she gets worse.  Reviewed imaging independently.  Normal chest x-ray see radiology report for full details.  Discussed imaging, MDM, treatment plan, and plan for follow-up with patient. Discussed sn/sx that should prompt return to the ED. patient agrees with plan.   Meds ordered this encounter  Medications  . albuterol (VENTOLIN HFA) 108 (90 Base) MCG/ACT inhaler    Sig: Inhale 1-2 puffs into the lungs every 6 (six) hours as needed for wheezing or shortness of breath.    Dispense:  18 g    Refill:  0  . fluticasone (FLONASE) 50 MCG/ACT nasal spray    Sig: Place 2 sprays into both nostrils daily.    Dispense:  16 g    Refill:  0  . Spacer/Aero-Holding Chambers (AEROCHAMBER PLUS) inhaler    Sig: Use as instructed    Dispense:  1 each    Refill:  2  . ibuprofen (ADVIL) 600 MG tablet    Sig: Take 1 tablet (600 mg total) by mouth every 6 (six) hours as needed.    Dispense:  30 tablet    Refill:  0  . benzonatate (TESSALON) 200 MG capsule    Sig: Take 1 capsule (200 mg total) by mouth 3 (three) times daily as needed for cough.    Dispense:  30  capsule    Refill:  0  . chlorpheniramine-HYDROcodone (TUSSIONEX PENNKINETIC ER) 10-8 MG/5ML SUER    Sig: Take 5 mLs by mouth every 12 (twelve) hours as needed for cough.    Dispense:  60 mL    Refill:  0    *This clinic note was created using [employer]. Therefore, there may be occasional mistakes despite careful proofreading.   ?    Melynda Ripple, MD 04/12/19 1301

## 2019-04-12 NOTE — Discharge Instructions (Addendum)
Your chest x-ray was normal.  2 puffs from your albuterol inhaler every 4 hours for 2 days, then every 6 hours for 2 days, then you may use it as needed.  Use a spacer with your albuterol.  Flonase, Mucinex D, saline nasal irrigation with a Milta Deiters med rinse and distilled water as often as you want for the nasal congestion.  Stop all other cold medications and Claritin.  Tessalon for the cough during the day, Tussionex for cough at night.  Follow-up with a primary care physician of your choice, see list below.  Your Covid test will take several days to come back.  Below is a list of primary care practices who are taking new patients for you to follow-up with.  [facility] at [facility] [address] [place], [place] [address] [phone]  [place], [place] [address] [phone]  [facility] [phone] [place] [address]  [facility]: [place]  [phone]  [place] and [facility]: [place]   [phone]  [facility]: [address] [place]  [phone]  [place] primary [facility] : [address] [phone]  [facility] Primary [facility]: [address] [place] [address] [phone]  [facility]: [place] [phone]  Dr. Blanchie Serve [place]  [phone]  Dr. Benito Mccreedy, [facility]. [place], [place] [address]  [phone]  Go to www.goodrx.com to look up your medications. This will give you a list of where you can find your  prescriptions at the most affordable prices. Or ask the pharmacist what the cash price is, or if they have any other discount programs available to help make your medication more affordable. This can be less expensive than what you would pay with insurance.

## 2019-04-14 LAB — NOVEL CORONAVIRUS, NAA (HOSP ORDER, SEND-OUT TO REF LAB; TAT 18-24 HRS): SARS-CoV-2, NAA: NOT DETECTED

## 2019-05-17 ENCOUNTER — Other Ambulatory Visit: Payer: Self-pay

## 2019-05-17 ENCOUNTER — Ambulatory Visit (HOSPITAL_COMMUNITY)
Admission: EM | Admit: 2019-05-17 | Discharge: 2019-05-17 | Disposition: A | Discharge status: Home / Self Care | Payer: 59 | Attending: Internal Medicine | Admitting: Internal Medicine

## 2019-05-17 ENCOUNTER — Ambulatory Visit (INDEPENDENT_AMBULATORY_CARE_PROVIDER_SITE_OTHER): Payer: 59

## 2019-05-17 ENCOUNTER — Encounter (HOSPITAL_COMMUNITY): Payer: Self-pay

## 2019-05-17 DIAGNOSIS — J209 Acute bronchitis, unspecified: Secondary | ICD-10-CM | POA: Diagnosis not present

## 2019-05-17 DIAGNOSIS — Z20828 Contact with and (suspected) exposure to other viral communicable diseases: Secondary | ICD-10-CM | POA: Insufficient documentation

## 2019-05-17 DIAGNOSIS — Z79899 Other long term (current) drug therapy: Secondary | ICD-10-CM | POA: Insufficient documentation

## 2019-05-17 DIAGNOSIS — R7303 Prediabetes: Secondary | ICD-10-CM | POA: Insufficient documentation

## 2019-05-17 DIAGNOSIS — R0602 Shortness of breath: Secondary | ICD-10-CM | POA: Diagnosis present

## 2019-05-17 MED ORDER — ALBUTEROL SULFATE HFA 108 (90 BASE) MCG/ACT IN AERS: 1.0000 | INHALATION_SPRAY | Freq: Four times a day (QID) | RESPIRATORY_TRACT | 0 refills | Status: AC | PRN

## 2019-05-17 MED ORDER — METHYLPREDNISOLONE SODIUM SUCC 125 MG IJ SOLR
60.0000 mg | Freq: Once | INTRAMUSCULAR | Status: AC
  Administered 2019-05-17: 15:00:00 60 mg via INTRAMUSCULAR

## 2019-05-17 MED ORDER — METHYLPREDNISOLONE SODIUM SUCC 125 MG IJ SOLR
INTRAMUSCULAR | Status: AC
  Filled 2019-05-17: qty 2

## 2019-05-17 MED ORDER — BENZONATATE 200 MG PO CAPS: 200.0000 mg | ORAL_CAPSULE | Freq: Three times a day (TID) | ORAL | 0 refills | Status: DC | PRN

## 2019-05-17 MED ORDER — PREDNISONE 20 MG PO TABS: 40.0000 mg | ORAL_TABLET | Freq: Every day | ORAL | 0 refills | Status: AC

## 2019-05-17 NOTE — ED Provider Notes (Signed)
[facility]    CSN: [id number] Arrival date & time: 05/17/19  1252      History   Chief Complaint Chief Complaint  Patient presents with  . Shortness of Breath  . Wheezing    HPI Carolyn Atkinson is a 28 y.o. female with history of gastroesophageal reflux disease comes to urgent care with shortness of breath, cough and wheezing.  Patient was seen in this urgent care on 11/7 for upper respiratory infection symptoms.  She tested negative for COVID-19.  Patient denies any fever or chills.  Shortness of breath has been persistent and is worsened with activity.  She denies any known relieving factors.  No chest pain or chest pressure.  She denies any fatigue, dizziness or near syncopal episode.  Patient denies any environmental allergies or food allergies.Marland Kitchen.   HPI  Past Medical History:  Diagnosis Date  . Dizziness   . Eczema   . GERD (gastroesophageal reflux disease)   . AOZHYQMV(784.6Headache(784.0)     Patient Active Problem List   Diagnosis Date Noted  . Chronic migraine 10/14/2018  . Chronic daily headache 10/14/2018  . Migraine without aura and without status migrainosus, not intractable 11/05/2014  . Prediabetes 10/06/2014    Past Surgical History:  Procedure Laterality Date  . LEEP    . TUBAL LIGATION      OB History    Gravida  2   Para  2   Term  1   Preterm  1   AB      Living  2     SAB      TAB      Ectopic      Multiple      Live Births  2            Home Medications    Prior to Admission medications   Medication Sig Start Date End Date Taking? Authorizing Provider  albuterol (VENTOLIN HFA) 108 (90 Base) MCG/ACT inhaler Inhale 1-2 puffs into the lungs every 6 (six) hours as needed for wheezing or shortness of breath. 05/17/19   Jasier Calabretta, Britta MccreedyPhilip O, MD  benzonatate (TESSALON) 200 MG capsule Take 1 capsule (200 mg total) by mouth 3 (three) times daily as needed for cough. 05/17/19   LampteyBritta Mccreedy, Jasmia Angst O, MD  chlorpheniramine-HYDROcodone  (TUSSIONEX PENNKINETIC ER) 10-8 MG/5ML SUER Take 5 mLs by mouth every 12 (twelve) hours as needed for cough. 04/12/19   Domenick GongMortenson, Ashley, MD  fluticasone (FLONASE) 50 MCG/ACT nasal spray Place 2 sprays into both nostrils daily. 04/12/19   Domenick GongMortenson, Ashley, MD  ibuprofen (ADVIL) 600 MG tablet Take 1 tablet (600 mg total) by mouth every 6 (six) hours as needed. 04/12/19   Domenick GongMortenson, Ashley, MD  predniSONE (DELTASONE) 20 MG tablet Take 2 tablets (40 mg total) by mouth daily with breakfast for 5 days. 05/17/19 05/22/19  Merrilee JanskyLamptey, Alexah Kivett O, MD  Spacer/Aero-Holding Chambers (AEROCHAMBER PLUS) inhaler Use as instructed 04/12/19   Domenick GongMortenson, Ashley, MD  rizatriptan (MAXALT-MLT) 10 MG disintegrating tablet Take 1 tablet (10 mg total) by mouth as needed. May repeat in 2 hours if needed Patient not taking: Reported on 01/29/2019 10/14/18 05/17/19  Levert FeinsteinYan, Yijun, MD  venlafaxine XR (EFFEXOR XR) 37.5 MG 24 hr capsule Take 1 capsule (37.5 mg total) by mouth daily with breakfast. Patient not taking: Reported on 01/29/2019 10/14/18 05/17/19  Levert FeinsteinYan, Yijun, MD    Family History Family History  Problem Relation Age of Onset  . Breast cancer Mother   . Stroke  Father   . Hypertension Maternal Grandmother   . Diabetes Maternal Grandmother   . Cancer Maternal Grandmother        unsure of type  . Headache Maternal Grandmother   . Aneurysm Maternal Grandmother   . Thyroid cancer Maternal Grandfather   . Heart disease Neg Hx     Social History Social History   Tobacco Use  . Smoking status: Never Smoker  . Smokeless tobacco: Never Used  Substance Use Topics  . Alcohol use: No  . Drug use: Never     Allergies   Acetaminophen, Toradol [ketorolac tromethamine], and Other   Review of Systems Review of Systems  Constitutional: Positive for activity change. Negative for chills, fatigue and fever.  HENT: Positive for congestion. Negative for rhinorrhea, sinus pressure, sinus pain and sore throat.   Respiratory: Positive  for cough, chest tightness, shortness of breath and wheezing. Negative for stridor.   Cardiovascular: Negative for chest pain and palpitations.  Gastrointestinal: Negative for diarrhea, nausea and vomiting.  Genitourinary: Negative.   Musculoskeletal: Negative for arthralgias and myalgias.  Skin: Negative for rash and wound.  Neurological: Negative for dizziness, weakness, light-headedness, numbness and headaches.  Psychiatric/Behavioral: Negative for confusion and decreased concentration.     Physical Exam Triage Vital Signs ED Triage Vitals [05/17/19 1350]  Enc Vitals Group     BP 119/74     Pulse Rate 61     Resp 16     Temp 98.6 F (37 C)     Temp Source Oral     SpO2 99 %     Weight      Height      Head Circumference      Peak Flow      Pain Score 0     Pain Loc      Pain Edu?      Excl. in GC?    No data found.  Updated Vital Signs BP 119/74 (BP Location: Left Arm)   Pulse 61   Temp 98.6 F (37 C) (Oral)   Resp 16   LMP 05/17/2019   SpO2 99%   Visual Acuity Right Eye Distance:   Left Eye Distance:   Bilateral Distance:    Right Eye Near:   Left Eye Near:    Bilateral Near:     Physical Exam Vitals signs and nursing note reviewed.  Constitutional:      Appearance: She is not ill-appearing or toxic-appearing.  HENT:     Mouth/Throat:     Pharynx: No oropharyngeal exudate.  Neck:     Musculoskeletal: Normal range of motion.     Thyroid: No thyromegaly.  Cardiovascular:     Rate and Rhythm: Normal rate and regular rhythm.  Pulmonary:     Effort: Pulmonary effort is normal. No accessory muscle usage or respiratory distress.     Breath sounds: Normal breath sounds. No decreased breath sounds, rhonchi or rales.  Chest:     Chest wall: No mass or deformity.  Abdominal:     Palpations: Abdomen is soft. There is no hepatomegaly or splenomegaly.  Musculoskeletal: Normal range of motion.     Right lower leg: No edema.     Left lower leg: No edema.   Lymphadenopathy:     Cervical: No cervical adenopathy.  Skin:    General: Skin is warm.     Capillary Refill: Capillary refill takes less than 2 seconds.     Findings: No erythema or rash.  Neurological:  General: No focal deficit present.     Mental Status: She is alert.      UC Treatments / Results  Labs (all labs ordered are listed, but only abnormal results are displayed) Labs Reviewed  NOVEL CORONAVIRUS, NAA (HOSP ORDER, SEND-OUT TO REF LAB; TAT 18-24 HRS)    EKG   Radiology No results found.  Procedures Procedures (including critical care time)  Medications Ordered in UC Medications  methylPREDNISolone sodium succinate (SOLU-MEDROL) 125 mg/2 mL injection 60 mg (60 mg Intramuscular Given 05/17/19 1510)  methylPREDNISolone sodium succinate (SOLU-MEDROL) 125 mg/2 mL injection (has no administration in time range)    Initial Impression / Assessment and Plan / UC Course  I have reviewed the triage vital signs and the nursing notes.  Pertinent labs & imaging results that were available during my care of the patient were reviewed by me and considered in my medical decision making (see chart for details).     1.  Acute bronchitis with bronchospasm: Tessalon Perles as needed for cough Albuterol inhaler every 6 hours as needed for shortness of breath/wheezing Prednisone 40 mg orally daily for 5 days Chest x-ray is negative for acute lung infiltrate If patient symptoms worsens she is advised to return to urgent care to be reevaluated.  Final Clinical Impressions(s) / UC Diagnoses   Final diagnoses:  Acute bronchitis with bronchospasm   Discharge Instructions   None    ED Prescriptions    Medication Sig Dispense Auth. Provider   albuterol (VENTOLIN HFA) 108 (90 Base) MCG/ACT inhaler Inhale 1-2 puffs into the lungs every 6 (six) hours as needed for wheezing or shortness of breath. 18 g Aalivia Mcgraw, Myrene Galas, MD   benzonatate (TESSALON) 200 MG capsule Take 1  capsule (200 mg total) by mouth 3 (three) times daily as needed for cough. 30 capsule Jaselyn Nahm, Myrene Galas, MD   predniSONE (DELTASONE) 20 MG tablet Take 2 tablets (40 mg total) by mouth daily with breakfast for 5 days. 10 tablet Filomeno Cromley, Myrene Galas, MD     PDMP not reviewed this encounter.   Chase Picket, MD 05/19/19 [phone]

## 2019-05-17 NOTE — ED Triage Notes (Signed)
Pt present SOB and wheezing, symptoms started a week ago. Pt states that when she lay down or walking it hard to catch her breathe

## 2019-05-18 LAB — NOVEL CORONAVIRUS, NAA (HOSP ORDER, SEND-OUT TO REF LAB; TAT 18-24 HRS): SARS-CoV-2, NAA: NOT DETECTED

## 2019-07-12 ENCOUNTER — Encounter: Payer: Self-pay | Admitting: Family Medicine

## 2019-12-03 DIAGNOSIS — N920 Excessive and frequent menstruation with regular cycle: Secondary | ICD-10-CM | POA: Diagnosis not present

## 2019-12-29 DIAGNOSIS — Z3202 Encounter for pregnancy test, result negative: Secondary | ICD-10-CM | POA: Diagnosis not present

## 2019-12-29 DIAGNOSIS — Z30430 Encounter for insertion of intrauterine contraceptive device: Secondary | ICD-10-CM | POA: Diagnosis not present

## 2020-01-07 DIAGNOSIS — M5415 Radiculopathy, thoracolumbar region: Secondary | ICD-10-CM | POA: Diagnosis not present

## 2020-01-07 DIAGNOSIS — M9905 Segmental and somatic dysfunction of pelvic region: Secondary | ICD-10-CM | POA: Diagnosis not present

## 2020-01-07 DIAGNOSIS — M9903 Segmental and somatic dysfunction of lumbar region: Secondary | ICD-10-CM | POA: Diagnosis not present

## 2020-01-07 DIAGNOSIS — M9902 Segmental and somatic dysfunction of thoracic region: Secondary | ICD-10-CM | POA: Diagnosis not present

## 2020-01-12 DIAGNOSIS — M5415 Radiculopathy, thoracolumbar region: Secondary | ICD-10-CM | POA: Diagnosis not present

## 2020-01-12 DIAGNOSIS — M9902 Segmental and somatic dysfunction of thoracic region: Secondary | ICD-10-CM | POA: Diagnosis not present

## 2020-01-12 DIAGNOSIS — M9903 Segmental and somatic dysfunction of lumbar region: Secondary | ICD-10-CM | POA: Diagnosis not present

## 2020-01-12 DIAGNOSIS — M9905 Segmental and somatic dysfunction of pelvic region: Secondary | ICD-10-CM | POA: Diagnosis not present

## 2020-01-27 DIAGNOSIS — N760 Acute vaginitis: Secondary | ICD-10-CM | POA: Diagnosis not present

## 2020-01-27 DIAGNOSIS — Z30431 Encounter for routine checking of intrauterine contraceptive device: Secondary | ICD-10-CM | POA: Diagnosis not present

## 2020-01-27 DIAGNOSIS — N898 Other specified noninflammatory disorders of vagina: Secondary | ICD-10-CM | POA: Diagnosis not present

## 2020-03-03 ENCOUNTER — Ambulatory Visit (HOSPITAL_COMMUNITY)
Admission: EM | Admit: 2020-03-03 | Discharge: 2020-03-03 | Disposition: A | Discharge status: Home / Self Care | Payer: Federal, State, Local not specified - PPO | Attending: Internal Medicine | Admitting: Internal Medicine

## 2020-03-03 ENCOUNTER — Other Ambulatory Visit: Payer: Self-pay

## 2020-03-03 ENCOUNTER — Encounter (HOSPITAL_COMMUNITY): Payer: Self-pay | Admitting: *Deleted

## 2020-03-03 DIAGNOSIS — U071 COVID-19: Secondary | ICD-10-CM | POA: Insufficient documentation

## 2020-03-03 DIAGNOSIS — B349 Viral infection, unspecified: Secondary | ICD-10-CM | POA: Diagnosis not present

## 2020-03-03 DIAGNOSIS — Z79899 Other long term (current) drug therapy: Secondary | ICD-10-CM | POA: Insufficient documentation

## 2020-03-03 DIAGNOSIS — R05 Cough: Secondary | ICD-10-CM | POA: Diagnosis not present

## 2020-03-03 DIAGNOSIS — B373 Candidiasis of vulva and vagina: Secondary | ICD-10-CM | POA: Insufficient documentation

## 2020-03-03 DIAGNOSIS — R7303 Prediabetes: Secondary | ICD-10-CM | POA: Diagnosis not present

## 2020-03-03 DIAGNOSIS — B3731 Acute candidiasis of vulva and vagina: Secondary | ICD-10-CM

## 2020-03-03 MED ORDER — FLUCONAZOLE 150 MG PO TABS: 150.0000 mg | ORAL_TABLET | Freq: Every day | ORAL | 1 refills | Status: DC

## 2020-03-03 NOTE — Discharge Instructions (Signed)
Your covid test is pending 

## 2020-03-03 NOTE — ED Triage Notes (Signed)
Patients states that she has been having a fever with chills that started on Wednesday. Patient states that she had a headache this morning but not currently. Patient denies any SOB, cough or other symptoms.

## 2020-03-03 NOTE — ED Provider Notes (Signed)
[facility]    CSN: [id number] Arrival date & time: 03/03/20  0865      History   Chief Complaint Chief Complaint  Patient presents with  . Fever    HPI Carolyn Atkinson is a 29 y.o. female.   The history is provided by the patient. No language interpreter was used.  Cough Cough characteristics:  Non-productive Sputum characteristics:  Nondescript Severity:  Moderate Onset quality:  Gradual Timing:  Constant Progression:  Worsening Chronicity:  New Smoker: no   Context: upper respiratory infection   Relieved by:  Nothing Worsened by:  Nothing Ineffective treatments:  None tried Risk factors: no recent infection     Past Medical History:  Diagnosis Date  . Dizziness   . Eczema   . GERD (gastroesophageal reflux disease)   . HQIONGEX(528.4)     Patient Active Problem List   Diagnosis Date Noted  . Chronic migraine 10/14/2018  . Chronic daily headache 10/14/2018  . Migraine without aura and without status migrainosus, not intractable 11/05/2014  . Prediabetes 10/06/2014    Past Surgical History:  Procedure Laterality Date  . LEEP    . TUBAL LIGATION      OB History    Gravida  2   Para  2   Term  1   Preterm  1   AB      Living  2     SAB      TAB      Ectopic      Multiple      Live Births  2            Home Medications    Prior to Admission medications   Medication Sig Start Date End Date Taking? Authorizing Provider  albuterol (VENTOLIN HFA) 108 (90 Base) MCG/ACT inhaler Inhale 1-2 puffs into the lungs every 6 (six) hours as needed for wheezing or shortness of breath. 05/17/19   Lamptey, Britta Mccreedy, MD  benzonatate (TESSALON) 200 MG capsule Take 1 capsule (200 mg total) by mouth 3 (three) times daily as needed for cough. 05/17/19   LampteyBritta Mccreedy, MD  chlorpheniramine-HYDROcodone (TUSSIONEX PENNKINETIC ER) 10-8 MG/5ML SUER Take 5 mLs by mouth every 12 (twelve) hours as needed for cough. 04/12/19   Domenick Gong,  MD  fluconazole (DIFLUCAN) 150 MG tablet Take 1 tablet (150 mg total) by mouth daily. 03/03/20   Elson Areas, PA-C  fluticasone (FLONASE) 50 MCG/ACT nasal spray Place 2 sprays into both nostrils daily. 04/12/19   Domenick Gong, MD  ibuprofen (ADVIL) 600 MG tablet Take 1 tablet (600 mg total) by mouth every 6 (six) hours as needed. 04/12/19   Domenick Gong, MD  Spacer/Aero-Holding Chambers (AEROCHAMBER PLUS) inhaler Use as instructed 04/12/19   Domenick Gong, MD  rizatriptan (MAXALT-MLT) 10 MG disintegrating tablet Take 1 tablet (10 mg total) by mouth as needed. May repeat in 2 hours if needed Patient not taking: Reported on 01/29/2019 10/14/18 05/17/19  Levert Feinstein, MD  venlafaxine XR (EFFEXOR XR) 37.5 MG 24 hr capsule Take 1 capsule (37.5 mg total) by mouth daily with breakfast. Patient not taking: Reported on 01/29/2019 10/14/18 05/17/19  Levert Feinstein, MD    Family History Family History  Problem Relation Age of Onset  . Breast cancer Mother   . Stroke Father   . Hypertension Maternal Grandmother   . Diabetes Maternal Grandmother   . Cancer Maternal Grandmother        unsure of type  . Headache  Maternal Grandmother   . Aneurysm Maternal Grandmother   . Thyroid cancer Maternal Grandfather   . Heart disease Neg Hx     Social History Social History   Tobacco Use  . Smoking status: Never Smoker  . Smokeless tobacco: Never Used  Vaping Use  . Vaping Use: Never used  Substance Use Topics  . Alcohol use: No  . Drug use: Never     Allergies   Acetaminophen, Toradol [ketorolac tromethamine], and Other   Review of Systems Review of Systems  Respiratory: Positive for cough.   All other systems reviewed and are negative.    Physical Exam Triage Vital Signs ED Triage Vitals  Enc Vitals Group     BP 03/03/20 1056 114/86     Pulse Rate 03/03/20 1056 (!) 116     Resp 03/03/20 1056 18     Temp 03/03/20 1056 99.6 F (37.6 C)     Temp Source 03/03/20 1056 Oral     SpO2 --       Weight --      Height --      Head Circumference --      Peak Flow --      Pain Score 03/03/20 1055 0     Pain Loc --      Pain Edu? --      Excl. in GC? --    No data found.  Updated Vital Signs BP 114/86 (BP Location: Right Arm)   Pulse (!) 116   Temp 99.6 F (37.6 C) (Oral)   Resp 18   LMP 02/18/2020   Visual Acuity Right Eye Distance:   Left Eye Distance:   Bilateral Distance:    Right Eye Near:   Left Eye Near:    Bilateral Near:     Physical Exam Vitals and nursing note reviewed.  Constitutional:      Appearance: She is well-developed.  HENT:     Head: Normocephalic.  Cardiovascular:     Rate and Rhythm: Normal rate.  Pulmonary:     Effort: Pulmonary effort is normal.  Abdominal:     General: There is no distension.  Musculoskeletal:        General: Normal range of motion.     Cervical back: Normal range of motion.  Skin:    General: Skin is warm.  Neurological:     General: No focal deficit present.     Mental Status: She is alert and oriented to person, place, and time.  Psychiatric:        Mood and Affect: Mood normal.      UC Treatments / Results  Labs (all labs ordered are listed, but only abnormal results are displayed) Labs Reviewed  SARS CORONAVIRUS 2 (TAT 6-24 HRS)    EKG   Radiology No results found.  Procedures Procedures (including critical care time)  Medications Ordered in UC Medications - No data to display  Initial Impression / Assessment and Plan / UC Course  I have reviewed the triage vital signs and the nursing notes.  Pertinent labs & imaging results that were available during my care of the patient were reviewed by me and considered in my medical decision making (see chart for details).    MDM: covid pending Pt request medication for yeast infection.  Pt given rx for diflucan  Final Clinical Impressions(s) / UC Diagnoses   Final diagnoses:  Viral illness  Yeast vaginitis     Discharge  Instructions     Your  covid test is pending   ED Prescriptions    Medication Sig Dispense Auth. Provider   fluconazole (DIFLUCAN) 150 MG tablet Take 1 tablet (150 mg total) by mouth daily. 1 tablet Elson Areas, [place]     PDMP not reviewed this encounter.  An After Visit Summary was printed and given to the patient.    Elson Areas, [place] 03/03/20 2006

## 2020-03-04 LAB — SARS CORONAVIRUS 2 (TAT 6-24 HRS): SARS Coronavirus 2: POSITIVE — AB

## 2020-06-23 IMAGING — CT CT HEAD WITHOUT CONTRAST
4 series · 17 of 47 positions shown, 19 images · non-contrast
Comparison: Brain MRI 05/30/2015

CLINICAL DATA: Altered level of consciousness (LOC), unexplained

EXAM:
CT HEAD WITHOUT CONTRAST
TECHNIQUE: Contiguous axial images were obtained from the base of the skull
through the vertex without intravenous contrast.

[Series 3: head wo · axial · 0.44mm/px · z∈[-624,-490]mm · 7 of 37 slices shown, 9 images]
[im 5/37  brain]
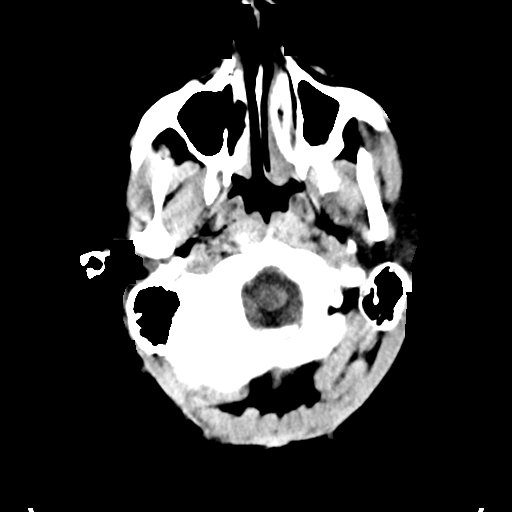
[im 5/37  bone]
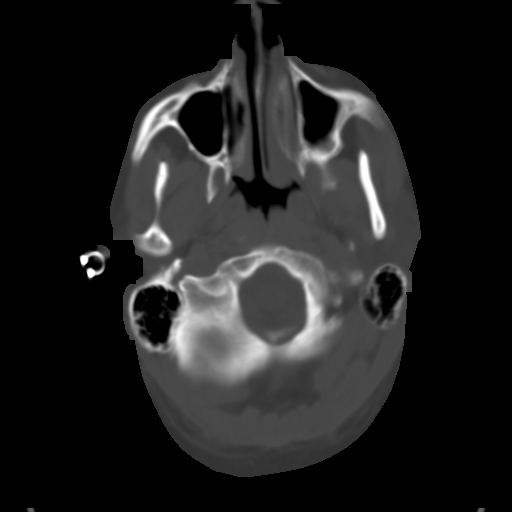
[im 10/37  brain]
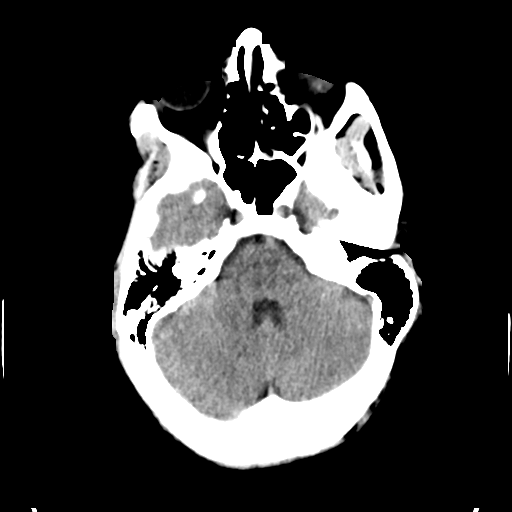
[im 14/37  brain]
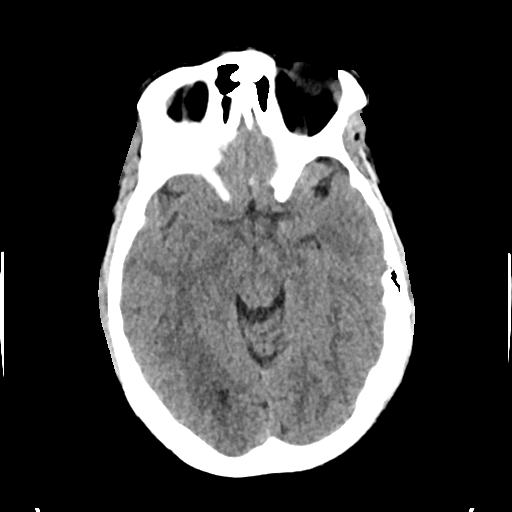
[im 19/37  brain]
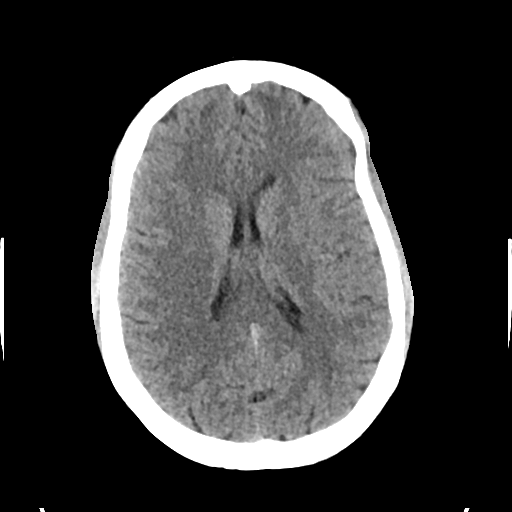
[im 23/37  brain]
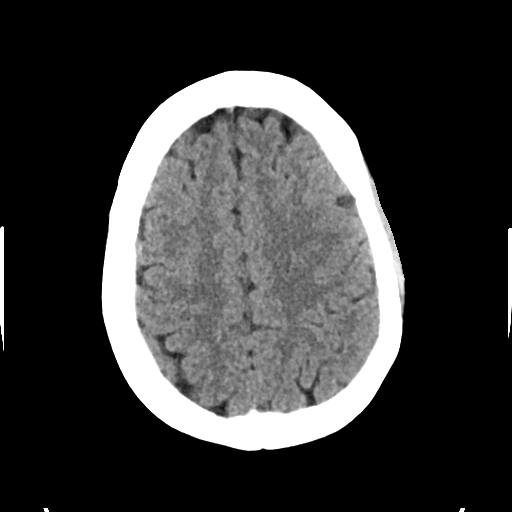
[im 23/37  bone]
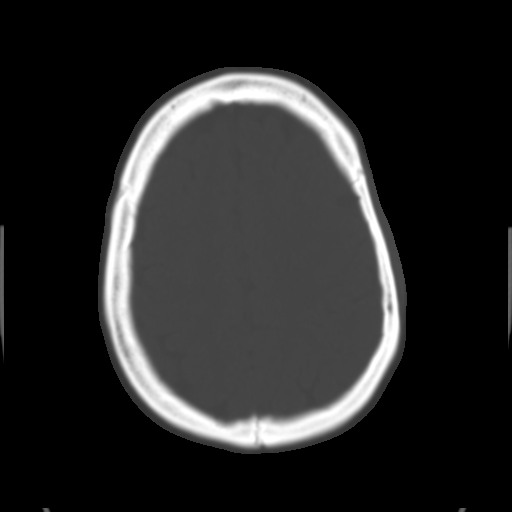
[im 28/37  brain]
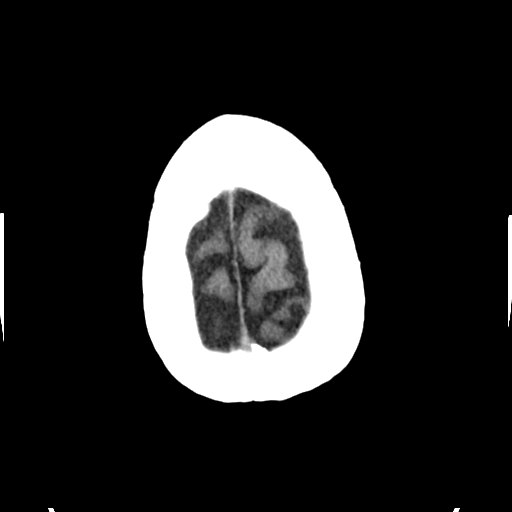
[im 32/37  brain]
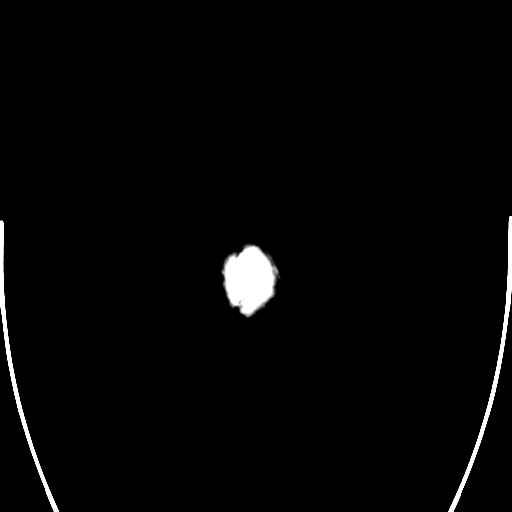

[Series 4: head bone · axial · 0.44mm/px · z∈[-626,-564]mm · 4 of 92 slices shown]
[im 10/92  bone]
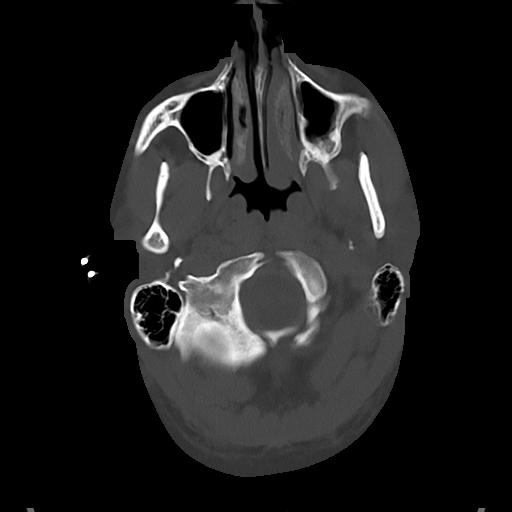
[im 19/92  bone]
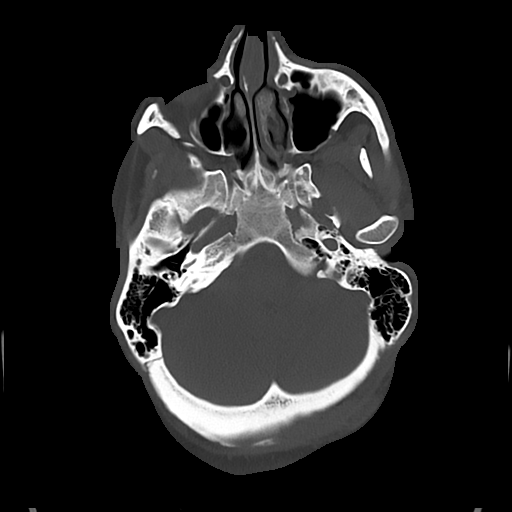
[im 28/92  bone]
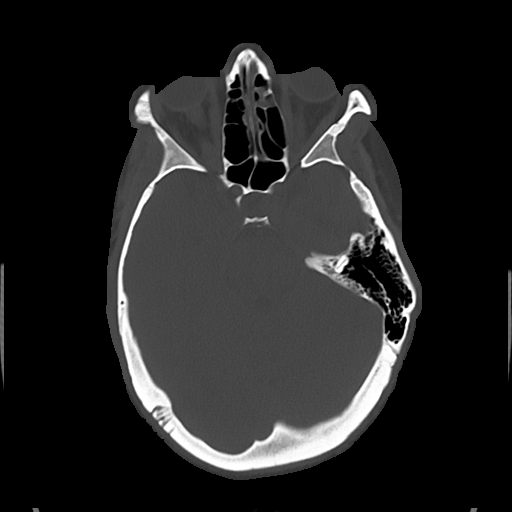
[im 41/92  bone]
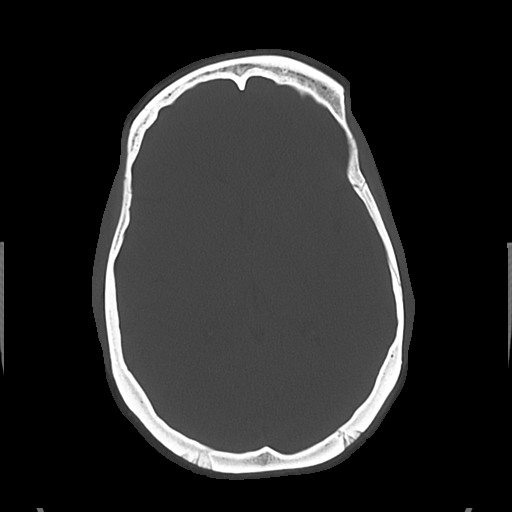

[Series 5: cor soft · coronal · 0.34mm/px · 3 of 75 slices shown]
[im 25/75  brain]
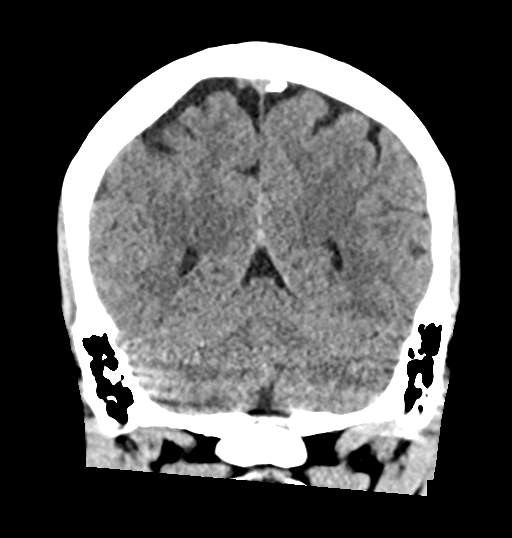
[im 33/75  brain]
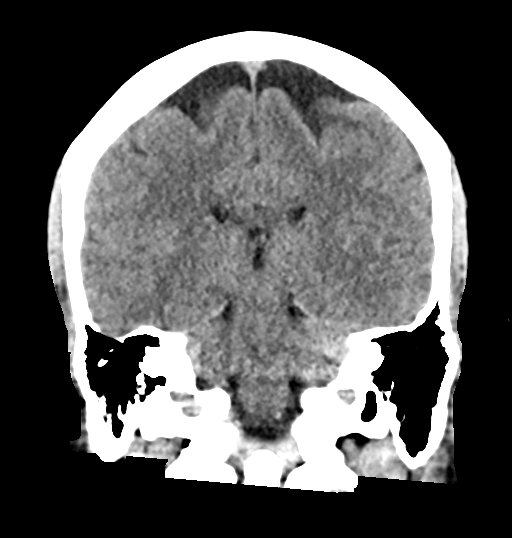
[im 42/75  brain]
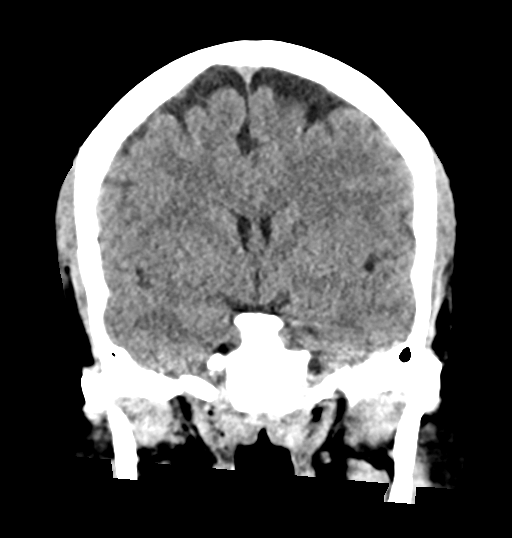

[Series 6: sag soft · sagittal · 0.37mm/px · 3 of 58 slices shown]
[im 20/58  brain]
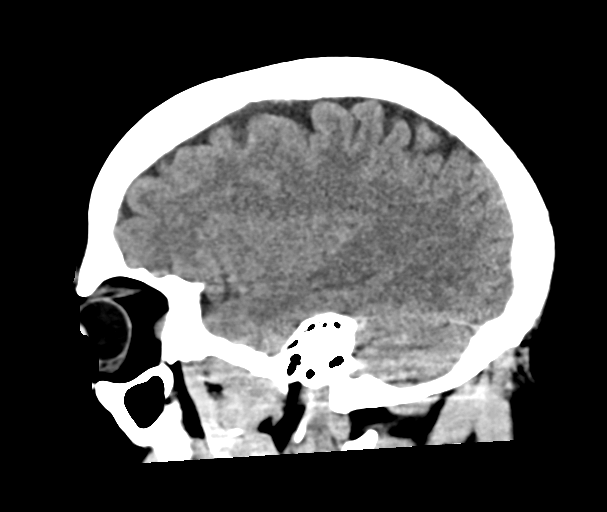
[im 29/58  brain]
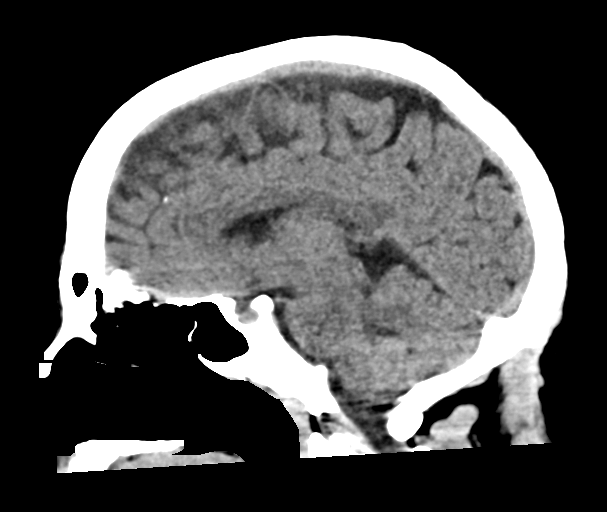
[im 39/58  brain]
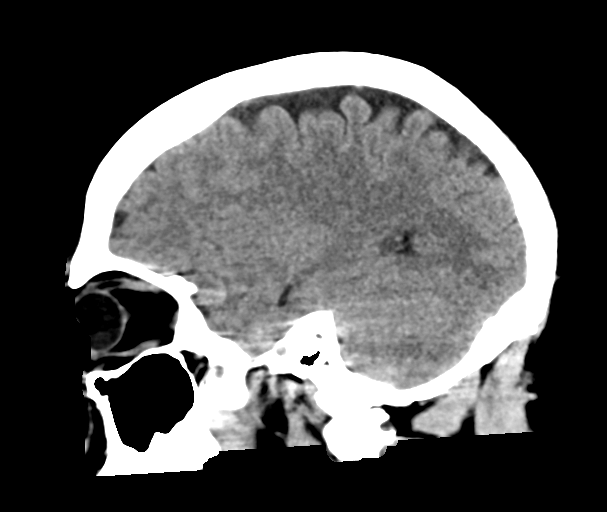

[17 of 47 positions shown; findings below may reference images not displayed]

FINDINGS: Brain: No intracranial hemorrhage, mass effect, or midline shift. No
hydrocephalus. The basilar cisterns are patent. No evidence of
territorial infarct or acute ischemia. No extra-axial or
intracranial fluid collection.

Vascular: No hyperdense vessel.

Skull: No fracture or focal lesion.

Sinuses/Orbits: Paranasal sinuses and mastoid air cells are clear.
The visualized orbits are unremarkable.

Other: None.
IMPRESSION: Unremarkable noncontrast head CT.

## 2020-12-22 ENCOUNTER — Encounter (HOSPITAL_COMMUNITY): Payer: Self-pay | Admitting: *Deleted

## 2020-12-22 ENCOUNTER — Emergency Department (HOSPITAL_COMMUNITY)
Admission: EM | Admit: 2020-12-22 | Discharge: 2020-12-22 | Disposition: A | Discharge status: Home / Self Care | Payer: Medicaid Other | Attending: Emergency Medicine | Admitting: Emergency Medicine

## 2020-12-22 ENCOUNTER — Other Ambulatory Visit: Payer: Self-pay

## 2020-12-22 DIAGNOSIS — S93509A Unspecified sprain of unspecified toe(s), initial encounter: Secondary | ICD-10-CM

## 2020-12-22 DIAGNOSIS — S93504A Unspecified sprain of right lesser toe(s), initial encounter: Secondary | ICD-10-CM | POA: Insufficient documentation

## 2020-12-22 DIAGNOSIS — S90934A Unspecified superficial injury of right lesser toe(s), initial encounter: Secondary | ICD-10-CM | POA: Diagnosis present

## 2020-12-22 DIAGNOSIS — Y9301 Activity, walking, marching and hiking: Secondary | ICD-10-CM | POA: Diagnosis not present

## 2020-12-22 DIAGNOSIS — Y9289 Other specified places as the place of occurrence of the external cause: Secondary | ICD-10-CM | POA: Diagnosis not present

## 2020-12-22 DIAGNOSIS — W010XXA Fall on same level from slipping, tripping and stumbling without subsequent striking against object, initial encounter: Secondary | ICD-10-CM | POA: Diagnosis not present

## 2020-12-22 MED ORDER — IBUPROFEN 600 MG PO TABS: 600.0000 mg | ORAL_TABLET | Freq: Four times a day (QID) | ORAL | 0 refills | Status: DC | PRN

## 2020-12-22 NOTE — ED Provider Notes (Signed)
[facility] EMERGENCY DEPARTMENT Provider Note   CSN: [id number] Arrival date & time: 12/22/20  0416     History Chief Complaint  Patient presents with   Toe Injury    Carolyn Atkinson is a 30 y.o. female.  The history is provided by the patient. No language interpreter was used.    30 year old female presenting for evaluation of toe injury.  Pt report last night as she was walking she accidentally hyperextended her 2nd R toe.  She report acute onset of sharp pain, which has since became more throbbing, persistent worse with ambulation.  She missed work because of it and is here requesting for a work note and to be evaluated.  She did take OTC pain meds at home with some relief.  No report of any numbness or other injury  Past Medical History:  Diagnosis Date   Dizziness    Eczema    GERD (gastroesophageal reflux disease)    Headache(784.0)     Patient Active Problem List   Diagnosis Date Noted   Chronic migraine 10/14/2018   Chronic daily headache 10/14/2018   Migraine without aura and without status migrainosus, not intractable 11/05/2014   Prediabetes 10/06/2014    Past Surgical History:  Procedure Laterality Date   LEEP     TUBAL LIGATION       OB History     Gravida  2   Para  2   Term  1   Preterm  1   AB      Living  2      SAB      IAB      Ectopic      Multiple      Live Births  2           Family History  Problem Relation Age of Onset   Breast cancer Mother    Stroke Father    Hypertension Maternal Grandmother    Diabetes Maternal Grandmother    Cancer Maternal Grandmother        unsure of type   Headache Maternal Grandmother    Aneurysm Maternal Grandmother    Thyroid cancer Maternal Grandfather    Heart disease Neg Hx     Social History   Tobacco Use   Smoking status: Never   Smokeless tobacco: Never  Vaping Use   Vaping Use: Never used  Substance Use Topics   Alcohol use: No   Drug use:  Never    Home Medications Prior to Admission medications   Medication Sig Start Date End Date Taking? Authorizing Provider  albuterol (VENTOLIN HFA) 108 (90 Base) MCG/ACT inhaler Inhale 1-2 puffs into the lungs every 6 (six) hours as needed for wheezing or shortness of breath. 05/17/19   Lamptey, Britta Mccreedy, MD  benzonatate (TESSALON) 200 MG capsule Take 1 capsule (200 mg total) by mouth 3 (three) times daily as needed for cough. 05/17/19   LampteyBritta Mccreedy, MD  chlorpheniramine-HYDROcodone (TUSSIONEX PENNKINETIC ER) 10-8 MG/5ML SUER Take 5 mLs by mouth every 12 (twelve) hours as needed for cough. 04/12/19   Domenick Gong, MD  fluconazole (DIFLUCAN) 150 MG tablet Take 1 tablet (150 mg total) by mouth daily. 03/03/20   Elson Areas, PA-C  fluticasone (FLONASE) 50 MCG/ACT nasal spray Place 2 sprays into both nostrils daily. 04/12/19   Domenick Gong, MD  ibuprofen (ADVIL) 600 MG tablet Take 1 tablet (600 mg total) by mouth every 6 (six) hours as needed.  04/12/19   Domenick Gong, MD  Spacer/Aero-Holding Chambers (AEROCHAMBER PLUS) inhaler Use as instructed 04/12/19   Domenick Gong, MD  rizatriptan (MAXALT-MLT) 10 MG disintegrating tablet Take 1 tablet (10 mg total) by mouth as needed. May repeat in 2 hours if needed Patient not taking: Reported on 01/29/2019 10/14/18 05/17/19  Levert Feinstein, MD  venlafaxine XR (EFFEXOR XR) 37.5 MG 24 hr capsule Take 1 capsule (37.5 mg total) by mouth daily with breakfast. Patient not taking: Reported on 01/29/2019 10/14/18 05/17/19  Levert Feinstein, MD    Allergies    Acetaminophen, Toradol [ketorolac tromethamine], and Other  Review of Systems   Review of Systems  Constitutional:  Negative for fever.  Musculoskeletal:  Positive for arthralgias.  Skin:  Negative for wound.  Neurological:  Negative for numbness.   Physical Exam Updated Vital Signs BP 132/84 (BP Location: Left Arm)   Pulse 83   Temp 98.3 F (36.8 C)   Resp 18   SpO2 97%   Physical  Exam Vitals and nursing note reviewed.  Constitutional:      General: She is not in acute distress.    Appearance: She is well-developed.  HENT:     Head: Atraumatic.  Eyes:     Conjunctiva/sclera: Conjunctivae normal.  Pulmonary:     Effort: Pulmonary effort is normal.  Musculoskeletal:        General: Tenderness (R foot: tenderness along 2nd toe with mild swelling but no deformity. brisk cap refill.  no tenderness to mid foot) present.     Cervical back: Neck supple.  Skin:    Findings: No rash.  Neurological:     Mental Status: She is alert.  Psychiatric:        Mood and Affect: Mood normal.    ED Results / Procedures / Treatments   Labs (all labs ordered are listed, but only abnormal results are displayed) Labs Reviewed - No data to display  EKG None  Radiology No results found.  Procedures Procedures   Medications Ordered in ED Medications - No data to display  ED Course  I have reviewed the triage vital signs and the nursing notes.  Pertinent labs & imaging results that were available during my care of the patient were reviewed by me and considered in my medical decision making (see chart for details).    MDM Rules/Calculators/A&P                          BP 132/84 (BP Location: Left Arm)   Pulse 83   Temp 98.3 F (36.8 C)   Resp 18   Ht 5' 6.5" (1.689 m)   Wt 113.4 kg   SpO2 97%   BMI 39.75 kg/m   Final Clinical Impression(s) / ED Diagnoses Final diagnoses:  Toe sprain, initial encounter    Rx / DC Orders ED Discharge Orders     None      4:28 AM Pt injured her R 2nd toe, it is likely a sprain.  Doubt fx.  Xray not obtained during this visit.  Will buddy tape toe and provide sxs treatment including RICE therapy.  Ortho referral as needed.    Fayrene Helper, PA-C 12/22/20 [id number]    Zadie Rhine, MD 12/22/20 [phone]

## 2020-12-22 NOTE — ED Triage Notes (Signed)
States she was at home approx. 5 pm  her feet were wet she was on her hardwood floor and slipped, states she is unsure how she came down however she is c/o pain in her right 2nd toe. States she took tylenol layed down and woke up with her toe hurting.

## 2020-12-22 NOTE — Discharge Instructions (Addendum)
You may buddy-taped your toe for comfort.  Take ibuprofen as needed for pain.  Followup with orthopedist as needed if you have persistent pain for more than a week.

## 2021-01-12 ENCOUNTER — Ambulatory Visit (HOSPITAL_COMMUNITY)
Admission: EM | Admit: 2021-01-12 | Discharge: 2021-01-12 | Disposition: A | Discharge status: Home / Self Care | Payer: Medicaid Other

## 2021-01-12 ENCOUNTER — Encounter (HOSPITAL_COMMUNITY): Payer: Self-pay | Admitting: *Deleted

## 2021-01-12 ENCOUNTER — Other Ambulatory Visit: Payer: Self-pay

## 2021-01-12 DIAGNOSIS — R079 Chest pain, unspecified: Secondary | ICD-10-CM | POA: Diagnosis not present

## 2021-01-12 NOTE — ED Provider Notes (Signed)
[facility]    CSN: [id number] Arrival date & time: 01/12/21  1912      History   Chief Complaint No chief complaint on file.   HPI Carolyn Atkinson is a 30 y.o. female.   Patient presenting today with 1 week history of left-sided chest pain, difficulty breathing, initially trouble swallowing but that has resolved.  States she was sent to the emergency department from work at onset of symptoms on 01/05/2021 and worked up in full for her symptoms without and worked up in full for her symptoms without obvious causation found.  Cardiac work-up was negative, labs overall benign other than a slightly elevated white blood cell count.  She states she was told that they think it might have something to do with her thyroid but that she would require further outpatient work-up.  She is still having the chest sensation and some mild dizziness that she is in the process of being worked up for.  She does not wish for any further work-up today, but did not feel up to going to work tonight and needs a note for this.  No new or worsening symptoms per patient.   Past Medical History:  Diagnosis Date   Dizziness    Eczema    GERD (gastroesophageal reflux disease)    Headache(784.0)     Patient Active Problem List   Diagnosis Date Noted   Chronic migraine 10/14/2018   Chronic daily headache 10/14/2018   Migraine without aura and without status migrainosus, not intractable 11/05/2014   Prediabetes 10/06/2014    Past Surgical History:  Procedure Laterality Date   LEEP     TUBAL LIGATION      OB History     Gravida  2   Para  2   Term  1   Preterm  1   AB      Living  2      SAB      IAB      Ectopic      Multiple      Live Births  2            Home Medications    Prior to Admission medications   Medication Sig Start Date End Date Taking? Authorizing Provider  albuterol (VENTOLIN HFA) 108 (90 Base) MCG/ACT inhaler Inhale 1-2 puffs into the lungs every  6 (six) hours as needed for wheezing or shortness of breath. 05/17/19  Yes Lamptey, Britta Mccreedy, MD  ibuprofen (ADVIL) 600 MG tablet Take 1 tablet (600 mg total) by mouth every 6 (six) hours as needed for mild pain or moderate pain. 12/22/20  Yes Fayrene Helper, PA-C  benzonatate (TESSALON) 200 MG capsule Take 1 capsule (200 mg total) by mouth 3 (three) times daily as needed for cough. 05/17/19   LampteyBritta Mccreedy, MD  chlorpheniramine-HYDROcodone (TUSSIONEX PENNKINETIC ER) 10-8 MG/5ML SUER Take 5 mLs by mouth every 12 (twelve) hours as needed for cough. 04/12/19   Domenick Gong, MD  fluconazole (DIFLUCAN) 150 MG tablet Take 1 tablet (150 mg total) by mouth daily. 03/03/20   Elson Areas, PA-C  fluticasone (FLONASE) 50 MCG/ACT nasal spray Place 2 sprays into both nostrils daily. 04/12/19   Domenick Gong, MD  Spacer/Aero-Holding Chambers (AEROCHAMBER PLUS) inhaler Use as instructed 04/12/19   Domenick Gong, MD  rizatriptan (MAXALT-MLT) 10 MG disintegrating tablet Take 1 tablet (10 mg total) by mouth as needed. May repeat in 2 hours if needed Patient not taking: Reported on 01/29/2019  10/14/18 05/17/19  Levert Feinstein, MD  venlafaxine XR (EFFEXOR XR) 37.5 MG 24 hr capsule Take 1 capsule (37.5 mg total) by mouth daily with breakfast. Patient not taking: Reported on 01/29/2019 10/14/18 05/17/19  Levert Feinstein, MD    Family History Family History  Problem Relation Age of Onset   Breast cancer Mother    Stroke Father    Hypertension Maternal Grandmother    Diabetes Maternal Grandmother    Cancer Maternal Grandmother        unsure of type   Headache Maternal Grandmother    Aneurysm Maternal Grandmother    Thyroid cancer Maternal Grandfather    Heart disease Neg Hx     Social History Social History   Tobacco Use   Smoking status: Never   Smokeless tobacco: Never  Vaping Use   Vaping Use: Never used  Substance Use Topics   Alcohol use: No   Drug use: Never     Allergies   Acetaminophen, Toradol  [ketorolac tromethamine], and Other   Review of Systems Review of Systems Per HPI  Physical Exam Triage Vital Signs ED Triage Vitals  Enc Vitals Group     BP 01/12/21 1922 128/79     Pulse Rate 01/12/21 1922 62     Resp 01/12/21 1922 20     Temp 01/12/21 1922 98.3 F (36.8 C)     Temp src --      SpO2 01/12/21 1922 99 %     Weight --      Height --      Head Circumference --      Peak Flow --      Pain Score 01/12/21 1923 0     Pain Loc --      Pain Edu? --      Excl. in [place]? --    No data found.  Updated Vital Signs BP 128/79   Pulse 62   Temp 98.3 F (36.8 C)   Resp 20   LMP  (LMP Unknown)   SpO2 99%   Visual Acuity Right Eye Distance:   Left Eye Distance:   Bilateral Distance:    Right Eye Near:   Left Eye Near:    Bilateral Near:     Physical Exam Vitals and nursing note reviewed.  Constitutional:      Appearance: Normal appearance. She is not ill-appearing.  HENT:     Head: Atraumatic.     Mouth/Throat:     Mouth: Mucous membranes are moist.     Pharynx: Oropharynx is clear.  Eyes:     Extraocular Movements: Extraocular movements intact.     Conjunctiva/sclera: Conjunctivae normal.  Cardiovascular:     Rate and Rhythm: Normal rate and regular rhythm.     Heart sounds: Normal heart sounds.  Pulmonary:     Effort: Pulmonary effort is normal.     Breath sounds: Normal breath sounds. No wheezing or rales.  Musculoskeletal:        General: Normal range of motion.     Cervical back: Normal range of motion and neck supple.  Skin:    General: Skin is warm and dry.  Neurological:     Mental Status: She is alert and oriented to person, place, and time.  Psychiatric:        Mood and Affect: Mood normal.        Thought Content: Thought content normal.        Judgment: Judgment normal.     UC Treatments /  Results  Labs (all labs ordered are listed, but only abnormal results are displayed) Labs Reviewed - No data to  display  EKG   Radiology No results found.  Procedures Procedures (including critical care time)  Medications Ordered in UC Medications - No data to display  Initial Impression / Assessment and Plan / UC Course  I have reviewed the triage vital signs and the nursing notes.  Pertinent labs & imaging results that were available during my care of the patient were reviewed by me and considered in my medical decision making (see chart for details).     Vitals and exam reassuring, she is in no acute distress and breathing comfortably on room air.  She is declining any additional work-up today as she is already undergoing outpatient work-up additionally to the emergency department work-up.  Work note provided per her request.  Follow-up with outpatient provider, ED if worsening.  Final Clinical Impressions(s) / UC Diagnoses   Final diagnoses:  Chest pain, unspecified type   Discharge Instructions   None    ED Prescriptions   None    PDMP not reviewed this encounter.   Particia Nearing, [place] 01/12/21 1956

## 2021-01-12 NOTE — ED Triage Notes (Signed)
Pt here for a work note because she can not go to work.

## 2022-09-08 ENCOUNTER — Ambulatory Visit (HOSPITAL_COMMUNITY)
Admission: EM | Admit: 2022-09-08 | Discharge: 2022-09-08 | Disposition: A | Discharge status: Home / Self Care | Payer: Medicaid Other | Attending: Internal Medicine | Admitting: Internal Medicine

## 2022-09-08 ENCOUNTER — Encounter (HOSPITAL_COMMUNITY): Payer: Self-pay | Admitting: Emergency Medicine

## 2022-09-08 DIAGNOSIS — N3001 Acute cystitis with hematuria: Secondary | ICD-10-CM | POA: Insufficient documentation

## 2022-09-08 LAB — POCT URINALYSIS DIPSTICK, ED / UC
Glucose, UA: NEGATIVE mg/dL
Ketones, ur: 15 mg/dL — AB
Nitrite: NEGATIVE
Protein, ur: NEGATIVE mg/dL
Specific Gravity, Urine: >=1.03 (ref 1.005–1.030)
Urobilinogen, UA: 0.2 mg/dL (ref 0.0–1.0)
pH: 5.5 (ref 5.0–8.0)

## 2022-09-08 MED ORDER — FLUCONAZOLE 150 MG PO TABS: 150.0000 mg | ORAL_TABLET | ORAL | 0 refills | Status: AC

## 2022-09-08 MED ORDER — CEPHALEXIN 500 MG PO CAPS: 500.0000 mg | ORAL_CAPSULE | Freq: Two times a day (BID) | ORAL | 0 refills | Status: AC

## 2022-09-08 NOTE — Discharge Instructions (Signed)
Your urine shows you likely have a urinary tract infection. I have sent your urine for culture to confirm this. We will go ahead and have you start taking antibiotics due to your symptoms.  Take antibiotic as directed.  (Keflex 500mg every 12 hours for 7 days) If you develop diarrhea while taking this medication you may purchase an over-the-counter probiotic or eat yogurt with live active cultures.  To avoid GI upset please take this medication with food. I have sent your urine for culture to see what type of bacteria grows. We will call you if we need to change the treatment plan based on the results of your urine culture.  If you develop any new or worsening symptoms or do not improve in the next 2 to 3 days, please return.  If your symptoms are severe, please go to the emergency room.  Follow-up with your primary care provider for further evaluation and management of your symptoms as well as ongoing wellness visits.  I hope you feel better!  

## 2022-09-08 NOTE — ED Provider Notes (Signed)
[facility]    CSN: [id number] Arrival date & time: 09/08/22  1732      History   Chief Complaint Chief Complaint  Patient presents with   Urinary Frequency    HPI Carolyn Atkinson is a 32 y.o. female.   Patient presents to urgent care for evaluation of urinary frequency and slight dysuria that started 3-4 days ago. Denies fever, body aches, nausea, vomiting, abdominal pain, diarrhea, flank pain, low back pain, vaginal symptoms, and recent antibiotic/steroid use. She is not a diabetic and denies use of SGLT-2 inhibitor. Denies frequent intake of urinary irritants. History of pyelonephritis. Has not attempted use of any OTC medicines. Denies chance of pregnancy, has IUD.    Urinary Frequency    Past Medical History:  Diagnosis Date   Dizziness    Eczema    GERD (gastroesophageal reflux disease)    Headache(784.0)     Patient Active Problem List   Diagnosis Date Noted   Chronic migraine 10/14/2018   Chronic daily headache 10/14/2018   Migraine without aura and without status migrainosus, not intractable 11/05/2014   Prediabetes 10/06/2014    Past Surgical History:  Procedure Laterality Date   LAPAROSCOPIC GASTRIC SLEEVE RESECTION     LEEP     TUBAL LIGATION      OB History     Gravida  2   Para  2   Term  1   Preterm  1   AB      Living  2      SAB      IAB      Ectopic      Multiple      Live Births  2            Home Medications    Prior to Admission medications   Medication Sig Start Date End Date Taking? Authorizing Provider  cephALEXin (KEFLEX) 500 MG capsule Take 1 capsule (500 mg total) by mouth 2 (two) times daily for 7 days. 09/08/22 09/15/22 Yes Talbot Grumbling, FNP  fluconazole (DIFLUCAN) 150 MG tablet Take 1 tablet (150 mg total) by mouth every 7 (seven) days. 09/08/22  Yes Talbot Grumbling, FNP  albuterol (VENTOLIN HFA) 108 (90 Base) MCG/ACT inhaler Inhale 1-2 puffs into the lungs every 6 (six) hours  as needed for wheezing or shortness of breath. 05/17/19   Lamptey, Myrene Galas, MD  benzonatate (TESSALON) 200 MG capsule Take 1 capsule (200 mg total) by mouth 3 (three) times daily as needed for cough. 05/17/19   LampteyMyrene Galas, MD  chlorpheniramine-HYDROcodone (TUSSIONEX PENNKINETIC ER) 10-8 MG/5ML SUER Take 5 mLs by mouth every 12 (twelve) hours as needed for cough. 04/12/19   Melynda Ripple, MD  fluticasone (FLONASE) 50 MCG/ACT nasal spray Place 2 sprays into both nostrils daily. 04/12/19   Melynda Ripple, MD  ibuprofen (ADVIL) 600 MG tablet Take 1 tablet (600 mg total) by mouth every 6 (six) hours as needed for mild pain or moderate pain. 12/22/20   Domenic Moras, PA-C  Spacer/Aero-Holding Chambers (AEROCHAMBER PLUS) inhaler Use as instructed 04/12/19   Melynda Ripple, MD  rizatriptan (MAXALT-MLT) 10 MG disintegrating tablet Take 1 tablet (10 mg total) by mouth as needed. May repeat in 2 hours if needed Patient not taking: Reported on 01/29/2019 10/14/18 05/17/19  Marcial Pacas, MD  venlafaxine XR (EFFEXOR XR) 37.5 MG 24 hr capsule Take 1 capsule (37.5 mg total) by mouth daily with breakfast. Patient not taking: Reported on 01/29/2019 10/14/18 05/17/19  Marcial Pacas, MD    Family History Family History  Problem Relation Age of Onset   Breast cancer Mother    Stroke Father    Hypertension Maternal Grandmother    Diabetes Maternal Grandmother    Cancer Maternal Grandmother        unsure of type   Headache Maternal Grandmother    Aneurysm Maternal Grandmother    Thyroid cancer Maternal Grandfather    Heart disease Neg Hx     Social History Social History   Tobacco Use   Smoking status: Never   Smokeless tobacco: Never  Vaping Use   Vaping Use: Never used  Substance Use Topics   Alcohol use: No   Drug use: Never     Allergies   Acetaminophen, Toradol [ketorolac tromethamine], and Other   Review of Systems Review of Systems  Genitourinary:  Positive for frequency.  Per  HPI   Physical Exam Triage Vital Signs ED Triage Vitals  Enc Vitals Group     BP 09/08/22 1754 114/71     Pulse Rate 09/08/22 1754 70     Resp 09/08/22 1754 16     Temp 09/08/22 1754 98.9 F (37.2 C)     Temp Source 09/08/22 1754 Oral     SpO2 09/08/22 1754 97 %     Weight --      Height --      Head Circumference --      Peak Flow --      Pain Score 09/08/22 1755 0     Pain Loc --      Pain Edu? --      Excl. in [place]? --    No data found.  Updated Vital Signs BP 114/71 (BP Location: Right Arm)   Pulse 70   Temp 98.9 F (37.2 C) (Oral)   Resp 16   SpO2 97%   Visual Acuity Right Eye Distance:   Left Eye Distance:   Bilateral Distance:    Right Eye Near:   Left Eye Near:    Bilateral Near:     Physical Exam Vitals and nursing note reviewed.  Constitutional:      Appearance: She is not ill-appearing or toxic-appearing.  HENT:     Head: Normocephalic and atraumatic.     Right Ear: Hearing and external ear normal.     Left Ear: Hearing and external ear normal.     Nose: Nose normal.     Mouth/Throat:     Lips: Pink.  Eyes:     General: Lids are normal. Vision grossly intact. Gaze aligned appropriately.     Extraocular Movements: Extraocular movements intact.     Conjunctiva/sclera: Conjunctivae normal.  Cardiovascular:     Rate and Rhythm: Normal rate and regular rhythm.     Heart sounds: Normal heart sounds, S1 normal and S2 normal.  Pulmonary:     Effort: Pulmonary effort is normal. No respiratory distress.     Breath sounds: Normal breath sounds and air entry.  Abdominal:     General: Bowel sounds are normal.     Palpations: Abdomen is soft.     Tenderness: There is no abdominal tenderness. There is no right CVA tenderness, left CVA tenderness or guarding.  Musculoskeletal:     Cervical back: Neck supple.  Skin:    General: Skin is warm and dry.     Capillary Refill: Capillary refill takes less than 2 seconds.     Findings: No rash.  Neurological:  General: No focal deficit present.     Mental Status: She is alert and oriented to person, place, and time. Mental status is at baseline.     Cranial Nerves: No dysarthria or facial asymmetry.  Psychiatric:        Mood and Affect: Mood normal.        Speech: Speech normal.        Behavior: Behavior normal.        Thought Content: Thought content normal.        Judgment: Judgment normal.      UC Treatments / Results  Labs (all labs ordered are listed, but only abnormal results are displayed) Labs Reviewed  POCT URINALYSIS DIPSTICK, ED / UC - Abnormal; Notable for the following components:      Result Value   Bilirubin Urine SMALL (*)    Ketones, ur 15 (*)    Hgb urine dipstick SMALL (*)    Leukocytes,Ua SMALL (*)    All other components within normal limits  URINE CULTURE    EKG   Radiology No results found.  Procedures Procedures (including critical care time)  Medications Ordered in UC Medications - No data to display  Initial Impression / Assessment and Plan / UC Course  I have reviewed the triage vital signs and the nursing notes.  Pertinent labs & imaging results that were available during my care of the patient were reviewed by me and considered in my medical decision making (see chart for details).   1. Acute cystitis with hemauria Presentation is consistent with acute uncomplicated cystitis. Patient is nontoxic in appearance with hemodynamically stable vital signs. Low suspicion for acute pyelonephritis. Low suspicion for kidney stone or infected stone. Urine pregnancy is negative. No indication for labs or imaging at this time.  Keflex sent to pharmacy. Denies allergies to antibiotics. Urine culture pending. Patient to push fluids to stay well hydrated and reduce intake of known urinary irritants. Patient reports history of vaginal yeast infections induced by antibiotic use, therefore diflucan sent to pharmacy to be taken as prescribed.  Discussed  physical exam and available lab work findings in clinic with patient.  Counseled patient regarding appropriate use of medications and potential side effects for all medications recommended or prescribed today. Discussed red flag signs and symptoms of worsening condition,when to call the PCP office, return to urgent care, and when to seek higher level of care in the emergency department. Patient verbalizes understanding and agreement with plan. All questions answered. Patient discharged in stable condition.    Final Clinical Impressions(s) / UC Diagnoses   Final diagnoses:  Acute cystitis with hematuria     Discharge Instructions      Your urine shows you likely have a urinary tract infection. I have sent your urine for culture to confirm this. We will go ahead and have you start taking antibiotics due to your symptoms.  Take antibiotic as directed.  (Keflex 500mg  every 12 hours for 7 days) If you develop diarrhea while taking this medication you may purchase an over-the-counter probiotic or eat yogurt with live active cultures.  To avoid GI upset please take this medication with food. I have sent your urine for culture to see what type of bacteria grows. We will call you if we need to change the treatment plan based on the results of your urine culture.  If you develop any new or worsening symptoms or do not improve in the next 2 to 3 days, please return.  If  your symptoms are severe, please go to the emergency room.  Follow-up with your primary care provider for further evaluation and management of your symptoms as well as ongoing wellness visits.  I hope you feel better!     ED Prescriptions     Medication Sig Dispense Auth. Provider   cephALEXin (KEFLEX) 500 MG capsule Take 1 capsule (500 mg total) by mouth 2 (two) times daily for 7 days. 14 capsule Joella Prince M, FNP   fluconazole (DIFLUCAN) 150 MG tablet Take 1 tablet (150 mg total) by mouth every 7 (seven) days. 2 tablet  Talbot Grumbling, FNP      PDMP not reviewed this encounter.   Talbot Grumbling, [place] 09/10/22 [phone]

## 2022-09-08 NOTE — ED Triage Notes (Signed)
Pt reports urinary frequency and dysuria for 3-4 days. Reports had gastric sleeve year ago and can't drink a lot of fluids so think caused UTI.

## 2022-09-09 LAB — URINE CULTURE: Culture: NO GROWTH

## 2023-11-04 ENCOUNTER — Other Ambulatory Visit: Payer: Self-pay

## 2023-11-04 ENCOUNTER — Emergency Department (HOSPITAL_COMMUNITY)
Admission: EM | Admit: 2023-11-04 | Discharge: 2023-11-05 | Disposition: A | Discharge status: Home / Self Care | Attending: Emergency Medicine | Admitting: Emergency Medicine

## 2023-11-04 ENCOUNTER — Emergency Department (HOSPITAL_COMMUNITY)

## 2023-11-04 ENCOUNTER — Encounter (HOSPITAL_COMMUNITY): Payer: Self-pay

## 2023-11-04 DIAGNOSIS — R42 Dizziness and giddiness: Secondary | ICD-10-CM | POA: Diagnosis present

## 2023-11-04 LAB — CBC
HCT: 35.4 % — ABNORMAL LOW (ref 36.0–46.0)
Hemoglobin: 10.9 g/dL — ABNORMAL LOW (ref 12.0–15.0)
MCH: 27.3 pg (ref 26.0–34.0)
MCHC: 30.8 g/dL (ref 30.0–36.0)
MCV: 88.5 fL (ref 80.0–100.0)
Platelets: 377 10*3/uL (ref 150–400)
RBC: 4 MIL/uL (ref 3.87–5.11)
RDW: 12.6 % (ref 11.5–15.5)
WBC: 8.2 10*3/uL (ref 4.0–10.5)
nRBC: 0 % (ref 0.0–0.2)

## 2023-11-04 LAB — TROPONIN I (HIGH SENSITIVITY)
Troponin I (High Sensitivity): 3 ng/L (ref <18)
Troponin I (High Sensitivity): 4 ng/L (ref <18)

## 2023-11-04 LAB — BASIC METABOLIC PANEL WITH GFR
Anion gap: 4 — ABNORMAL LOW (ref 5–15)
BUN: 10 mg/dL (ref 6–20)
CO2: 26 mmol/L (ref 22–32)
Calcium: 8.4 mg/dL — ABNORMAL LOW (ref 8.9–10.3)
Chloride: 106 mmol/L (ref 98–111)
Creatinine, Ser: 0.93 mg/dL (ref 0.44–1.00)
GFR, Estimated: >60 mL/min (ref >60)
Glucose, Bld: 96 mg/dL (ref 70–99)
Potassium: 4 mmol/L (ref 3.5–5.1)
Sodium: 136 mmol/L (ref 135–145)

## 2023-11-04 LAB — TYPE AND SCREEN
ABO/RH(D): O NEG
Antibody Screen: NEGATIVE

## 2023-11-04 LAB — HCG, SERUM, QUALITATIVE: Preg, Serum: NEGATIVE

## 2023-11-04 NOTE — ED Provider Notes (Signed)
 [facility] Emergency Department Provider Note MRN:  [id number]  Arrival date & time: 11/05/23     Chief Complaint   Dizziness, Shortness of Breath, Chest Pain, and Vaginal Bleeding   History of Present Illness   Carolyn Atkinson is a 33 y.o. year-old female presents to the ED with chief complaint of headache.  Has been worsening throughout the day.  She reports associated lightheadedness.  Reports associated chest pain.  States that she has had swelling in the bilateral ankles.  Denies fevers, but has had chills.  She also reports that she has had some vaginal bleeding that has been intermittent, but reports that she had had heavier than normal vaginal bleeding.  She states that she followed up with her PCP this week and had low iron.  Is working on getting scheduled for an iron infusion.  States that she is on day 5-6 of OCPs and also has an implant.  History provided by patient.   Review of Systems  Pertinent positive and negative review of systems noted in HPI.    Physical Exam   Vitals:   11/05/23 0115 11/05/23 0130  BP: 110/75 105/79  Pulse: (!) 44 (!) 46  Resp: 15 15  Temp:    SpO2: 100% 100%    CONSTITUTIONAL:  non- toxic appearing, NAD NEURO:  Alert and oriented x 3, CN 3-12 grossly intact EYES:  eyes equal and reactive ENT/NECK:  Supple, no stridor  CARDIO:  normal rate, regular rhythm, appears well-perfused  PULM:  No respiratory distress, CTAB GI/GU:  non-distended, non-specific  MSK/SPINE:  No gross deformities, no edema, moves all extremities  SKIN:  no rash, atraumatic   *Additional and/or pertinent findings included in MDM below  Diagnostic and Interventional Summary    EKG Interpretation Date/Time:    Ventricular Rate:    PR Interval:    QRS Duration:    QT Interval:    QTC Calculation:   R Axis:      Text Interpretation:         Labs Reviewed  BASIC METABOLIC PANEL WITH GFR - Abnormal; Notable for the following  components:      Result Value   Calcium 8.4 (*)    Anion gap 4 (*)    All other components within normal limits  CBC - Abnormal; Notable for the following components:   Hemoglobin 10.9 (*)    HCT 35.4 (*)    All other components within normal limits  RESP PANEL BY RT-PCR (RSV, FLU A&B, COVID)  RVPGX2  HCG, SERUM, QUALITATIVE  BRAIN NATRIURETIC PEPTIDE  D-DIMER, QUANTITATIVE  TYPE AND SCREEN  TROPONIN I (HIGH SENSITIVITY)  TROPONIN I (HIGH SENSITIVITY)    DG Chest 2 View  Final Result      Medications - No data to display   Procedures  /  Critical Care Procedures  ED Course and Medical Decision Making  I have reviewed the triage vital signs, the nursing notes, and pertinent available records from the EMR.  Social Determinants Affecting Complexity of Care: Patient has no clinically significant social determinants affecting this chief complaint..   ED Course: Clinical Course as of 11/05/23 0307  Tue Nov 04, 2023  2343 CBC(!) Anemic to 10.9, but a bit better from 10.6 on 5/20. [RB]  2344 HR during my history is 46-60. [RB]    Clinical Course User Index [RB] Sherel Dikes, PA-C    Medical Decision Making Patient here with symptoms of lightheadedness and dizziness.  She  reports worsening symptoms over the past day or so.  She is currently being evaluated by her PCP for iron deficient anemia.  She is scheduled for an iron transfusion.  Last hemoglobin with PCP was 10.6, she is 10.9 today.  She is taking OCPs for vaginal bleeding.  Her workup in the emergency department is largely reassuring.  D-dimer negative, doubt PE.  BNP is normal.  Normal RVP.  Troponins are negative.  Chest x-ray negative.  Sinus rhythm on EKG.  Will have patient follow-up with PCP.  Amount and/or Complexity of Data Reviewed Labs: ordered. Decision-making details documented in ED Course. Radiology: ordered.         Consultants: No consultations were needed in caring for this  patient.   Treatment and Plan: I considered admission due to patient's initial presentation, but after considering the examination and diagnostic results, patient will not require admission and can be discharged with outpatient follow-up.  Patient discussed with attending physician, Dr. Carol Chroman, who agrees that workup is reassuring and patient can be discharged home with outpatient follow-up..  Final Clinical Impressions(s) / ED Diagnoses     ICD-10-CM   1. Dizziness  R42       ED Discharge Orders     None         Discharge Instructions Discussed with and Provided to Patient:   Discharge Instructions   None      Sherel Dikes, PA-C 11/05/23 0309    Ballard Bongo, MD 11/05/23 [phone]

## 2023-11-04 NOTE — ED Triage Notes (Signed)
 T c/o dizziness x 3 days, bilateral ankle swelling, and intermittent cp; states iron has been low, plan to start infusion; denies sob, no cp currently; states she has IUD in arm, recently started having heavy vaginal bleeding; on day 6 of po birth control; states having to change ultra tampon qH

## 2023-11-05 LAB — RESP PANEL BY RT-PCR (RSV, FLU A&B, COVID)  RVPGX2
Influenza A by PCR: NEGATIVE
Influenza B by PCR: NEGATIVE
Resp Syncytial Virus by PCR: NEGATIVE
SARS Coronavirus 2 by RT PCR: NEGATIVE

## 2023-11-05 LAB — BRAIN NATRIURETIC PEPTIDE: B Natriuretic Peptide: 18.4 pg/mL (ref 0.0–100.0)

## 2023-11-05 LAB — D-DIMER, QUANTITATIVE: D-Dimer, Quant: <0.27 ug{FEU}/mL (ref 0.00–0.50)

## 2023-11-05 NOTE — ED Notes (Signed)
 Pt able to walk in the hallway with no assistance. Pt stated no increase weakness, or dizziness. Pt reported intense head pain. Pt HR maintain between 75-85. RN notified.

## 2023-12-09 ENCOUNTER — Ambulatory Visit (HOSPITAL_COMMUNITY): Payer: Self-pay

## 2023-12-09 ENCOUNTER — Encounter (HOSPITAL_COMMUNITY): Payer: Self-pay

## 2023-12-09 ENCOUNTER — Ambulatory Visit (HOSPITAL_COMMUNITY)
Admission: RE | Admit: 2023-12-09 | Discharge: 2023-12-09 | Disposition: A | Discharge status: Home / Self Care | Source: Ambulatory Visit | Attending: Family Medicine | Admitting: Family Medicine

## 2023-12-09 VITALS — BP 104/69 | HR 73 | Temp 98.2°F | Resp 16

## 2023-12-09 DIAGNOSIS — R109 Unspecified abdominal pain: Secondary | ICD-10-CM | POA: Diagnosis not present

## 2023-12-09 DIAGNOSIS — R3915 Urgency of urination: Secondary | ICD-10-CM | POA: Insufficient documentation

## 2023-12-09 DIAGNOSIS — N10 Acute tubulo-interstitial nephritis: Secondary | ICD-10-CM | POA: Diagnosis not present

## 2023-12-09 LAB — POCT URINALYSIS DIP (MANUAL ENTRY)
Bilirubin, UA: NEGATIVE
Glucose, UA: NEGATIVE mg/dL
Ketones, POC UA: NEGATIVE mg/dL
Nitrite, UA: NEGATIVE
Protein Ur, POC: 100 mg/dL — AB
Spec Grav, UA: 1.02 (ref 1.010–1.025)
Urobilinogen, UA: 0.2 U/dL
pH, UA: 5.5 (ref 5.0–8.0)

## 2023-12-09 LAB — COMPREHENSIVE METABOLIC PANEL WITH GFR
ALT: 13 U/L (ref 0–44)
AST: 16 U/L (ref 15–41)
Albumin: 3.4 g/dL — ABNORMAL LOW (ref 3.5–5.0)
Alkaline Phosphatase: 71 U/L (ref 38–126)
Anion gap: 9 (ref 5–15)
BUN: 6 mg/dL (ref 6–20)
CO2: 25 mmol/L (ref 22–32)
Calcium: 8.8 mg/dL — ABNORMAL LOW (ref 8.9–10.3)
Chloride: 105 mmol/L (ref 98–111)
Creatinine, Ser: 0.8 mg/dL (ref 0.44–1.00)
GFR, Estimated: >60 mL/min (ref >60)
Glucose, Bld: 99 mg/dL (ref 70–99)
Potassium: 4.2 mmol/L (ref 3.5–5.1)
Sodium: 139 mmol/L (ref 135–145)
Total Bilirubin: 1 mg/dL (ref 0.0–1.2)
Total Protein: 6.5 g/dL (ref 6.5–8.1)

## 2023-12-09 LAB — CBC
HCT: 38 % (ref 36.0–46.0)
Hemoglobin: 11.9 g/dL — ABNORMAL LOW (ref 12.0–15.0)
MCH: 28.2 pg (ref 26.0–34.0)
MCHC: 31.3 g/dL (ref 30.0–36.0)
MCV: 90 fL (ref 80.0–100.0)
Platelets: 291 10*3/uL (ref 150–400)
RBC: 4.22 MIL/uL (ref 3.87–5.11)
RDW: 14.7 % (ref 11.5–15.5)
WBC: 12.6 10*3/uL — ABNORMAL HIGH (ref 4.0–10.5)
nRBC: 0 % (ref 0.0–0.2)

## 2023-12-09 MED ORDER — TAMSULOSIN HCL 0.4 MG PO CAPS
0.4000 mg | ORAL_CAPSULE | Freq: Every day | ORAL | 0 refills | Status: AC
Start: 2023-12-09 — End: ?

## 2023-12-09 MED ORDER — IBUPROFEN 800 MG PO TABS: 800.0000 mg | ORAL_TABLET | Freq: Three times a day (TID) | ORAL | 0 refills | Status: AC

## 2023-12-09 MED ORDER — PHENAZOPYRIDINE HCL 200 MG PO TABS: 200.0000 mg | ORAL_TABLET | Freq: Three times a day (TID) | ORAL | 0 refills | Status: AC

## 2023-12-09 MED ORDER — CIPROFLOXACIN HCL 500 MG PO TABS: 500.0000 mg | ORAL_TABLET | Freq: Two times a day (BID) | ORAL | 0 refills | Status: AC

## 2023-12-09 NOTE — ED Provider Notes (Signed)
 [facility]    CSN: [id number] Arrival date & time: 12/09/23  1335      History   Chief Complaint Chief Complaint  Patient presents with   130p appt    HPI Carolyn Atkinson is a 33 y.o. female.   Patient presents with left flank pain, urinary frequency/urgency, and malodorous dark urine x 3 days.  Patient states that she has also noticed some blood in her urine as well.  Patient does report history of pyelonephritis and states that her symptoms are similar to when she has had this in the past.    Denies dysuria, abdominal pain, fever, body aches, chills, and weakness.  Patient also reports history of anemia.  Denies vaginal bleeding, vaginal pain, abnormal vaginal discharge  The history is provided by the patient and medical records.    Past Medical History:  Diagnosis Date   Dizziness    Eczema    GERD (gastroesophageal reflux disease)    Headache(784.0)     Patient Active Problem List   Diagnosis Date Noted   Chronic migraine 10/14/2018   Chronic daily headache 10/14/2018   Migraine without aura and without status migrainosus, not intractable 11/05/2014   Prediabetes 10/06/2014    Past Surgical History:  Procedure Laterality Date   LAPAROSCOPIC GASTRIC SLEEVE RESECTION     LEEP     TUBAL LIGATION      OB History     Gravida  2   Para  2   Term  1   Preterm  1   AB      Living  2      SAB      IAB      Ectopic      Multiple      Live Births  2            Home Medications    Prior to Admission medications   Medication Sig Start Date End Date Taking? Authorizing Provider  ciprofloxacin (CIPRO) 500 MG tablet Take 1 tablet (500 mg total) by mouth every 12 (twelve) hours for 7 days. 12/09/23 12/16/23 Yes Meer Reindl A, NP  ibuprofen  (ADVIL ) 800 MG tablet Take 1 tablet (800 mg total) by mouth 3 (three) times daily. 12/09/23  Yes Johnie, Janalyn Higby A, NP  phenazopyridine (PYRIDIUM) 200 MG tablet Take 1 tablet (200 mg total) by  mouth 3 (three) times daily. 12/09/23  Yes Johnie, Hogan Hoobler A, NP  tamsulosin (FLOMAX) 0.4 MG CAPS capsule Take 1 capsule (0.4 mg total) by mouth daily. 12/09/23  Yes Johnie, Raynesha Tiedt A, NP  albuterol  (VENTOLIN  HFA) 108 (90 Base) MCG/ACT inhaler Inhale 1-2 puffs into the lungs every 6 (six) hours as needed for wheezing or shortness of breath. 05/17/19   LampteyAleene KIDD, MD  chlorpheniramine-HYDROcodone  (TUSSIONEX PENNKINETIC ER) 10-8 MG/5ML SUER Take 5 mLs by mouth every 12 (twelve) hours as needed for cough. 04/12/19   Van Knee, MD  fluconazole  (DIFLUCAN ) 150 MG tablet Take 1 tablet (150 mg total) by mouth every 7 (seven) days. 09/08/22   Enedelia Dorna HERO, FNP  fluticasone  (FLONASE ) 50 MCG/ACT nasal spray Place 2 sprays into both nostrils daily. 04/12/19   Van Knee, MD  Spacer/Aero-Holding Chambers (AEROCHAMBER PLUS) inhaler Use as instructed 04/12/19   Mortenson, Ashley, MD  rizatriptan  (MAXALT -MLT) 10 MG disintegrating tablet Take 1 tablet (10 mg total) by mouth as needed. May repeat in 2 hours if needed Patient not taking: Reported on 01/29/2019 10/14/18 05/17/19  Onita Duos, MD  venlafaxine  XR (EFFEXOR  XR) 37.5 MG 24 hr capsule Take 1 capsule (37.5 mg total) by mouth daily with breakfast. Patient not taking: Reported on 01/29/2019 10/14/18 05/17/19  Onita Duos, MD    Family History Family History  Problem Relation Age of Onset   Breast cancer Mother    Stroke Father    Hypertension Maternal Grandmother    Diabetes Maternal Grandmother    Cancer Maternal Grandmother        unsure of type   Headache Maternal Grandmother    Aneurysm Maternal Grandmother    Thyroid cancer Maternal Grandfather    Heart disease Neg Hx     Social History Social History   Tobacco Use   Smoking status: Never   Smokeless tobacco: Never  Vaping Use   Vaping status: Never Used  Substance Use Topics   Alcohol use: No   Drug use: Never     Allergies   Acetaminophen , Toradol  [ketorolac   tromethamine ], and Other   Review of Systems Review of Systems  Per HPI  Physical Exam Triage Vital Signs ED Triage Vitals  Encounter Vitals Group     BP 12/09/23 1345 104/69     Girls Systolic BP Percentile --      Girls Diastolic BP Percentile --      Boys Systolic BP Percentile --      Boys Diastolic BP Percentile --      Pulse Rate 12/09/23 1345 73     Resp 12/09/23 1345 16     Temp 12/09/23 1345 98.2 F (36.8 C)     Temp Source 12/09/23 1345 Oral     SpO2 12/09/23 1345 98 %     Weight --      Height --      Head Circumference --      Peak Flow --      Pain Score 12/09/23 1343 10     Pain Loc --      Pain Education --      Exclude from Growth Chart --    No data found.  Updated Vital Signs BP 104/69 (BP Location: Right Arm)   Pulse 73   Temp 98.2 F (36.8 C) (Oral)   Resp 16   SpO2 98%   Visual Acuity Right Eye Distance:   Left Eye Distance:   Bilateral Distance:    Right Eye Near:   Left Eye Near:    Bilateral Near:     Physical Exam Vitals and nursing note reviewed.  Constitutional:      General: She is awake. She is not in acute distress.    Appearance: Normal appearance. She is well-developed and well-groomed. She is ill-appearing. She is not toxic-appearing or diaphoretic.  Abdominal:     General: Abdomen is flat. There is no distension.     Palpations: Abdomen is soft.     Tenderness: There is no abdominal tenderness. There is left CVA tenderness. There is no guarding or rebound.   Skin:    General: Skin is warm and dry.   Neurological:     Mental Status: She is alert.   Psychiatric:        Behavior: Behavior is cooperative.      UC Treatments / Results  Labs (all labs ordered are listed, but only abnormal results are displayed) Labs Reviewed  POCT URINALYSIS DIP (MANUAL ENTRY) - Abnormal; Notable for the following components:      Result Value   Color, UA brown (*)    Clarity,  UA cloudy (*)    Blood, UA large (*)    Protein  Ur, POC =100 (*)    Leukocytes, UA Small (1+) (*)    All other components within normal limits  URINE CULTURE  COMPREHENSIVE METABOLIC PANEL WITH GFR  CBC    EKG   Radiology No results found.  Procedures Procedures (including critical care time)  Medications Ordered in UC Medications - No data to display  Initial Impression / Assessment and Plan / UC Course  I have reviewed the triage vital signs and the nursing notes.  Pertinent labs & imaging results that were available during my care of the patient were reviewed by me and considered in my medical decision making (see chart for details).     Patient is mildly ill-appearing.  Left CVA tenderness noted.  No other significant findings upon exam.  Urinalysis reveals small leukocytes, and large RBCs, will send urine culture to confirm.  Ordered CBC and CMP to evaluate hemoglobin, renal function, and electrolytes.  Empirically treating with ciprofloxacin for pyelonephritis.  Prescribed Flomax for possible presence of kidney stone.  Prescribed Pyridium for urinary symptoms.  Prescribed ibuprofen  as needed for pain.  Discussed follow-up, return, and strict ER precautions Final Clinical Impressions(s) / UC Diagnoses   Final diagnoses:  Flank pain  Urinary urgency  Acute pyelonephritis     Discharge Instructions      Start taking ciprofloxacin twice daily for 7 days for possible kidney infection. Take Flomax once daily for possible kidney stone. Take Pyridium every 8 hours for urinary pain and urgency.  You can take one 800 mg ibuprofen  every 8 hours as needed for pain. We have drawn some lab work today to evaluate your kidney function, blood counts, and electrolytes.  We have also sent a urine culture to confirm presence of urinary tract infection.  If any of your results are concerning or require an adjustment in treatment someone will call and advise any additional treatment at that time. You can [facility]up with [facility] for further evaluation and management if needed. Otherwise follow-up with your primary care provider or return here as needed. If you develop worsening back pain, increased blood in your urine, fever, body aches, chills, weakness, or passing out please seek immediate medical treatment in the emergency department.    ED Prescriptions     Medication Sig Dispense Auth. Provider   ibuprofen  (ADVIL ) 800 MG tablet Take 1 tablet (800 mg total) by mouth 3 (three) times daily. 21 tablet Johnie Flaming A, NP   ciprofloxacin (CIPRO) 500 MG tablet Take 1 tablet (500 mg total) by mouth every 12 (twelve) hours for 7 days. 14 tablet Johnie Flaming A, NP   tamsulosin (FLOMAX) 0.4 MG CAPS capsule Take 1 capsule (0.4 mg total) by mouth daily. 10 capsule Johnie Flaming A, NP   phenazopyridine (PYRIDIUM) 200 MG tablet Take 1 tablet (200 mg total) by mouth 3 (three) times daily. 6 tablet Johnie Flaming A, NP      PDMP not reviewed this encounter.   Johnie Flaming A, NP 12/09/23 1510

## 2023-12-09 NOTE — ED Triage Notes (Signed)
 For past 2 days having lower back pains. Urine is really dark and has chalky smell to it for 3 days. Tried to take medication that didn't help.

## 2023-12-09 NOTE — Discharge Instructions (Signed)
 Start taking ciprofloxacin twice daily for 7 days for possible kidney infection. Take Flomax once daily for possible kidney stone. Take Pyridium every 8 hours for urinary pain and urgency.  You can take one 800 mg ibuprofen  every 8 hours as needed for pain. We have drawn some lab work today to evaluate your kidney function, blood counts, and electrolytes.  We have also sent a urine culture to confirm presence of urinary tract infection.  If any of your results are concerning or require an adjustment in treatment someone will call and advise any additional treatment at that time. You can follow-up with [facility] for further evaluation and management if needed. Otherwise follow-up with your primary care provider or return here as needed. If you develop worsening back pain, increased blood in your urine, fever, body aches, chills, weakness, or passing out please seek immediate medical treatment in the emergency department.

## 2023-12-11 LAB — URINE CULTURE: Culture: >=100000 — AB

## 2023-12-24 ENCOUNTER — Other Ambulatory Visit: Payer: Self-pay | Admitting: Medical Genetics

## 2023-12-26 ENCOUNTER — Encounter: Payer: Self-pay | Admitting: Advanced Practice Midwife

## 2024-03-26 ENCOUNTER — Other Ambulatory Visit: Payer: Self-pay | Admitting: Medical Genetics

## 2024-03-26 DIAGNOSIS — Z006 Encounter for examination for normal comparison and control in clinical research program: Secondary | ICD-10-CM

## 2024-05-13 ENCOUNTER — Emergency Department (HOSPITAL_BASED_OUTPATIENT_CLINIC_OR_DEPARTMENT_OTHER)
Admission: EM | Admit: 2024-05-13 | Discharge: 2024-05-13 | Disposition: A | Discharge status: Home / Self Care | Attending: Emergency Medicine | Admitting: Emergency Medicine

## 2024-05-13 ENCOUNTER — Other Ambulatory Visit: Payer: Self-pay

## 2024-05-13 ENCOUNTER — Encounter (HOSPITAL_BASED_OUTPATIENT_CLINIC_OR_DEPARTMENT_OTHER): Payer: Self-pay | Admitting: Emergency Medicine

## 2024-05-13 DIAGNOSIS — N390 Urinary tract infection, site not specified: Secondary | ICD-10-CM | POA: Diagnosis not present

## 2024-05-13 DIAGNOSIS — H00015 Hordeolum externum left lower eyelid: Secondary | ICD-10-CM | POA: Diagnosis not present

## 2024-05-13 DIAGNOSIS — R300 Dysuria: Secondary | ICD-10-CM | POA: Diagnosis present

## 2024-05-13 LAB — URINALYSIS, ROUTINE W REFLEX MICROSCOPIC
Bilirubin Urine: NEGATIVE
Glucose, UA: NEGATIVE mg/dL
Ketones, ur: NEGATIVE mg/dL
Nitrite: NEGATIVE
Protein, ur: 30 mg/dL — AB
RBC / HPF: >50 RBC/hpf (ref 0–5)
Specific Gravity, Urine: 1.029 (ref 1.005–1.030)
pH: 5.5 (ref 5.0–8.0)

## 2024-05-13 LAB — PREGNANCY, URINE: Preg Test, Ur: NEGATIVE

## 2024-05-13 MED ORDER — CEPHALEXIN 500 MG PO CAPS: 500.0000 mg | ORAL_CAPSULE | Freq: Two times a day (BID) | ORAL | 0 refills | Status: AC

## 2024-05-13 MED ORDER — CEPHALEXIN 500 MG PO CAPS: 500.0000 mg | ORAL_CAPSULE | Freq: Two times a day (BID) | ORAL | 0 refills | Status: DC

## 2024-05-13 NOTE — ED Triage Notes (Signed)
 Pt in with reported concern for UTI and flank pain. Pt states she's had symptoms of UTI x 2 days now, reports burning w/urination and hematuria. Also reporting pain to L eye - thinks she has a stye

## 2024-05-13 NOTE — ED Notes (Signed)
 Spoke with lab to add on urine culture.

## 2024-05-13 NOTE — ED Provider Notes (Signed)
 [place] EMERGENCY DEPARTMENT AT [facility] Provider Note   CSN: [id number] Arrival date & time: 05/13/24  9358     Patient presents with: Flank Pain   Carolyn Atkinson is a 33 y.o. female.   33 year old female history of gastric bypass who presents to the emergency department with urinary frequency and dysuria.  Symptoms going on for 2 days.  Has chronic back pain but no changes or flank pain.  No fevers or nausea or vomiting.  No vaginal discharge.  Has not been sexually active since January.  No concern for STIs.  Says that for the last week also has been having a stye in her left eye that initially was painful but has been using warm compresses and has been getting better       Prior to Admission medications   Medication Sig Start Date End Date Taking? Authorizing Provider  albuterol  (VENTOLIN  HFA) 108 (90 Base) MCG/ACT inhaler Inhale 1-2 puffs into the lungs every 6 (six) hours as needed for wheezing or shortness of breath. 05/17/19   Lamptey, Aleene KIDD, MD  cephALEXin  (KEFLEX ) 500 MG capsule Take 1 capsule (500 mg total) by mouth 2 (two) times daily for 7 days. 05/13/24 05/20/24  Carolyn Lamar BROCKS, MD  chlorpheniramine-HYDROcodone  (TUSSIONEX PENNKINETIC ER) 10-8 MG/5ML SUER Take 5 mLs by mouth every 12 (twelve) hours as needed for cough. 04/12/19   Van Knee, MD  fluconazole  (DIFLUCAN ) 150 MG tablet Take 1 tablet (150 mg total) by mouth every 7 (seven) days. 09/08/22   Enedelia Dorna HERO, FNP  fluticasone  (FLONASE ) 50 MCG/ACT nasal spray Place 2 sprays into both nostrils daily. 04/12/19   Van Knee, MD  ibuprofen  (ADVIL ) 800 MG tablet Take 1 tablet (800 mg total) by mouth 3 (three) times daily. 12/09/23   Johnie Flaming A, NP  phenazopyridine  (PYRIDIUM ) 200 MG tablet Take 1 tablet (200 mg total) by mouth 3 (three) times daily. 12/09/23   Johnie Flaming LABOR, NP  Spacer/Aero-Holding Chambers (AEROCHAMBER PLUS) inhaler Use as instructed 04/12/19   Mortenson,  Ashley, MD  tamsulosin  (FLOMAX ) 0.4 MG CAPS capsule Take 1 capsule (0.4 mg total) by mouth daily. 12/09/23   Johnie Flaming A, NP  rizatriptan  (MAXALT -MLT) 10 MG disintegrating tablet Take 1 tablet (10 mg total) by mouth as needed. May repeat in 2 hours if needed Patient not taking: Reported on 01/29/2019 10/14/18 05/17/19  Onita Duos, MD  venlafaxine  XR (EFFEXOR  XR) 37.5 MG 24 hr capsule Take 1 capsule (37.5 mg total) by mouth daily with breakfast. Patient not taking: Reported on 01/29/2019 10/14/18 05/17/19  Onita Duos, MD    Allergies: Acetaminophen , Toradol  [ketorolac  tromethamine ], and Other    Review of Systems  Updated Vital Signs BP 117/75   Pulse 60   Temp 98.2 F (36.8 C) (Oral)   Resp 18   Wt 113.4 kg   SpO2 100%   BMI 39.75 kg/m   Physical Exam HENT:     Head: Normocephalic and atraumatic.  Eyes:     Extraocular Movements: Extraocular movements intact.     Conjunctiva/sclera: Conjunctivae normal.     Pupils: Pupils are equal, round, and reactive to light.     Comments: Hordeolum on left lower eyelid  Abdominal:     General: There is no distension.     Palpations: There is no mass.     Tenderness: There is no abdominal tenderness. There is no right CVA tenderness, left CVA tenderness or guarding.     (all labs ordered are  listed, but only abnormal results are displayed) Labs Reviewed  URINALYSIS, ROUTINE W REFLEX MICROSCOPIC - Abnormal; Notable for the following components:      Result Value   Hgb urine dipstick LARGE (*)    Protein, ur 30 (*)    Leukocytes,Ua SMALL (*)    Bacteria, UA RARE (*)    All other components within normal limits  URINE CULTURE  PREGNANCY, URINE    EKG: None  Radiology: No results found.   Procedures   Medications Ordered in the ED - No data to display                                  Medical Decision Making Amount and/or Complexity of Data Reviewed Labs: ordered.  Risk Prescription drug management.   Carolyn Atkinson is a 33 year old female history of frequent UTIs and gastric bypass who presents emergency department for urinary frequency and dysuria  Initial Ddx:  UTI, pyelonephritis, STI, hordeolum  MDM/Course:  Patient presents to the emergency department with urinary frequency and dysuria.  Suspect that she is having cystitis.  No new flank pain, fevers, chills, or nausea or vomiting to suggest pyelonephritis.  Not sexually active since January so feel that STI is highly unlikely. Does also appear to have a hordeolum in her left eye that we will treat.  Urinalysis consistent with UTI.  Will start her on antibiotics and have her use warm compresses for her eye.  Given [facility] follow-up given the frequency of her UTIs  This patient presents to the ED for concern of complaints listed in HPI, this involves an extensive number of treatment options, and is a complaint that carries with it a high risk of complications and morbidity. Disposition including potential need for admission considered.   Dispo: DC Home. Return precautions discussed including, but not limited to, those listed in the AVS. Allowed pt time to ask questions which were answered fully prior to dc.  Records reviewed Outpatient Clinic Notes The following labs were independently interpreted: Urinalysis and show urinary tract infection I have reviewed the patients home medications and made adjustments as needed  Portions of this note were generated with Dragon dictation software. Dictation errors may occur despite best attempts at proofreading.     Final diagnoses:  Urinary tract infection with hematuria, site unspecified    ED Discharge Orders          Ordered    cephALEXin  (KEFLEX ) 500 MG capsule  2 times daily,   Status:  Discontinued        05/13/24 0749    cephALEXin  (KEFLEX ) 500 MG capsule  2 times daily        05/13/24 0753               Carolyn Lamar BROCKS, MD 05/14/24 [phone]

## 2024-05-13 NOTE — Discharge Instructions (Signed)
 You were seen for your urinary tract infection in the emergency department.   At home, please take the Keflex  we have prescribed you for your urinary tract infection.  For your eye the antibiotics should help treat this.  Continue warm compresses.  Check your MyChart online for the results of any tests that had not resulted by the time you left the emergency department.   Follow-up with your primary doctor in 2-3 days regarding your visit.  Please follow-up with urology soon as possible to figure out why you keep having recurrent urinary tract infections.  Return immediately to the emergency department if you experience any of the following: Fevers, flank pain, or any other concerning symptoms.    Thank you for visiting our Emergency Department. It was a pleasure taking care of you today.

## 2024-05-13 NOTE — ED Notes (Signed)

## 2024-05-14 LAB — URINE CULTURE: Culture: 30000 — AB

## 2024-05-15 ENCOUNTER — Telehealth (HOSPITAL_BASED_OUTPATIENT_CLINIC_OR_DEPARTMENT_OTHER): Payer: Self-pay | Admitting: *Deleted

## 2024-05-15 NOTE — Telephone Encounter (Signed)
 Post ED Visit - Positive Culture Follow-up  Culture report reviewed by antimicrobial stewardship pharmacist: [facility] []  Rankin Dee, Pharm.D. []  Venetia Gully, Pharm.D., BCPS AQ-ID []  Garrel Crews, Pharm.D., BCPS []  Almarie Lunger, Pharm.D., BCPS []  [place], [place].D., BCPS, AAHIVP []  Rosaline Bihari, Pharm.D., BCPS, AAHIVP []  Vernell Meier, PharmD, BCPS []  Latanya Hint, PharmD, BCPS []  Donald Medley, PharmD, BCPS []  Rocky Bold, PharmD []  Dorothyann Alert, PharmD, BCPS [x]  Dorn Buttner, PharmD  [facility] []  Rosaline Edison, PharmD []  Romona Bliss, PharmD []  Dolphus Roller, PharmD []  Veva Seip, Rph []  Vernell Daunt) Leonce, PharmD []  Eva Allis, PharmD []  Rosaline Millet, PharmD []  Iantha Batch, PharmD []  Arvin Gauss, PharmD []  Wanda Hasting, PharmD []  Ronal Rav, PharmD []  Rocky Slade, PharmD []  Bard Jeans, PharmD   Positive urine culture Treated with Cephalexin , organism sensitive to the same and no further patient follow-up is required at this time.  Albino Alan Novak 05/15/2024, 1:10 PM
